# Patient Record
Sex: Female | Born: 1955 | Race: White | Hispanic: No | Smoking: Current every day smoker
Health system: Southern US, Community
[De-identification: ages and names within clinical notes are randomized; demographics above are authoritative.]

## PROBLEM LIST (undated history)

## (undated) DIAGNOSIS — M1990 Unspecified osteoarthritis, unspecified site: Secondary | ICD-10-CM

## (undated) DIAGNOSIS — I10 Essential (primary) hypertension: Secondary | ICD-10-CM

## (undated) DIAGNOSIS — B1920 Unspecified viral hepatitis C without hepatic coma: Secondary | ICD-10-CM

## (undated) HISTORY — PX: BREAST BIOPSY: SHX20

## (undated) HISTORY — PX: BREAST EXCISIONAL BIOPSY: SUR124

---
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
## 2014-12-26 ENCOUNTER — Other Ambulatory Visit: Payer: Self-pay | Admitting: Orthopedic Surgery

## 2014-12-26 DIAGNOSIS — M2392 Unspecified internal derangement of left knee: Secondary | ICD-10-CM

## 2014-12-26 DIAGNOSIS — M25562 Pain in left knee: Secondary | ICD-10-CM

## 2015-01-02 ENCOUNTER — Ambulatory Visit
Admission: RE | Admit: 2015-01-02 | Discharge: 2015-01-02 | Disposition: A | Discharge status: Home / Self Care | Payer: PRIVATE HEALTH INSURANCE | Source: Ambulatory Visit | Attending: Orthopedic Surgery | Admitting: Orthopedic Surgery

## 2015-01-02 DIAGNOSIS — S83242A Other tear of medial meniscus, current injury, left knee, initial encounter: Secondary | ICD-10-CM | POA: Insufficient documentation

## 2015-01-02 DIAGNOSIS — M25562 Pain in left knee: Secondary | ICD-10-CM | POA: Diagnosis present

## 2015-01-02 DIAGNOSIS — M2392 Unspecified internal derangement of left knee: Secondary | ICD-10-CM

## 2015-01-02 DIAGNOSIS — X58XXXA Exposure to other specified factors, initial encounter: Secondary | ICD-10-CM | POA: Insufficient documentation

## 2015-01-02 DIAGNOSIS — M179 Osteoarthritis of knee, unspecified: Secondary | ICD-10-CM | POA: Diagnosis not present

## 2015-01-08 ENCOUNTER — Other Ambulatory Visit: Payer: Self-pay | Admitting: Family Medicine

## 2015-01-08 DIAGNOSIS — Z1231 Encounter for screening mammogram for malignant neoplasm of breast: Secondary | ICD-10-CM

## 2015-01-15 ENCOUNTER — Ambulatory Visit
Admission: RE | Admit: 2015-01-15 | Discharge: 2015-01-15 | Disposition: A | Discharge status: Home / Self Care | Payer: PRIVATE HEALTH INSURANCE | Source: Ambulatory Visit | Attending: Family Medicine | Admitting: Family Medicine

## 2015-01-15 DIAGNOSIS — Z1231 Encounter for screening mammogram for malignant neoplasm of breast: Secondary | ICD-10-CM | POA: Insufficient documentation

## 2016-01-12 ENCOUNTER — Other Ambulatory Visit: Payer: Self-pay | Admitting: Family Medicine

## 2016-01-12 DIAGNOSIS — Z1231 Encounter for screening mammogram for malignant neoplasm of breast: Secondary | ICD-10-CM

## 2016-01-21 ENCOUNTER — Ambulatory Visit
Admission: RE | Admit: 2016-01-21 | Discharge: 2016-01-21 | Disposition: A | Discharge status: Home / Self Care | Payer: BLUE CROSS/BLUE SHIELD | Source: Ambulatory Visit | Attending: Family Medicine | Admitting: Family Medicine

## 2016-01-21 DIAGNOSIS — Z1231 Encounter for screening mammogram for malignant neoplasm of breast: Secondary | ICD-10-CM | POA: Diagnosis not present

## 2016-10-27 ENCOUNTER — Encounter: Payer: Self-pay | Admitting: *Deleted

## 2016-10-28 ENCOUNTER — Ambulatory Visit: Payer: BLUE CROSS/BLUE SHIELD | Admitting: Anesthesiology

## 2016-10-28 ENCOUNTER — Encounter
Admission: RE | Disposition: A | Discharge status: Home / Self Care | Payer: Self-pay | Source: Ambulatory Visit | Attending: Unknown Physician Specialty

## 2016-10-28 ENCOUNTER — Ambulatory Visit
Admission: RE | Admit: 2016-10-28 | Discharge: 2016-10-28 | Disposition: A | Discharge status: Home / Self Care | Payer: BLUE CROSS/BLUE SHIELD | Source: Ambulatory Visit | Attending: Unknown Physician Specialty | Admitting: Unknown Physician Specialty

## 2016-10-28 ENCOUNTER — Encounter: Payer: Self-pay | Admitting: *Deleted

## 2016-10-28 DIAGNOSIS — Z7982 Long term (current) use of aspirin: Secondary | ICD-10-CM | POA: Diagnosis not present

## 2016-10-28 DIAGNOSIS — Z9889 Other specified postprocedural states: Secondary | ICD-10-CM | POA: Insufficient documentation

## 2016-10-28 DIAGNOSIS — K635 Polyp of colon: Secondary | ICD-10-CM | POA: Insufficient documentation

## 2016-10-28 DIAGNOSIS — Z79891 Long term (current) use of opiate analgesic: Secondary | ICD-10-CM | POA: Insufficient documentation

## 2016-10-28 DIAGNOSIS — Z79899 Other long term (current) drug therapy: Secondary | ICD-10-CM | POA: Insufficient documentation

## 2016-10-28 DIAGNOSIS — Z1211 Encounter for screening for malignant neoplasm of colon: Secondary | ICD-10-CM | POA: Diagnosis present

## 2016-10-28 DIAGNOSIS — I10 Essential (primary) hypertension: Secondary | ICD-10-CM | POA: Diagnosis not present

## 2016-10-28 DIAGNOSIS — F17210 Nicotine dependence, cigarettes, uncomplicated: Secondary | ICD-10-CM | POA: Diagnosis not present

## 2016-10-28 DIAGNOSIS — K640 First degree hemorrhoids: Secondary | ICD-10-CM | POA: Insufficient documentation

## 2016-10-28 DIAGNOSIS — Z6841 Body Mass Index (BMI) 40.0 and over, adult: Secondary | ICD-10-CM | POA: Insufficient documentation

## 2016-10-28 DIAGNOSIS — M1990 Unspecified osteoarthritis, unspecified site: Secondary | ICD-10-CM | POA: Diagnosis not present

## 2016-10-28 DIAGNOSIS — Z803 Family history of malignant neoplasm of breast: Secondary | ICD-10-CM | POA: Diagnosis not present

## 2016-10-28 HISTORY — DX: Morbid (severe) obesity due to excess calories: E66.01

## 2016-10-28 HISTORY — PX: COLONOSCOPY WITH PROPOFOL: SHX5780

## 2016-10-28 HISTORY — DX: Essential (primary) hypertension: I10

## 2016-10-28 HISTORY — DX: Unspecified osteoarthritis, unspecified site: M19.90

## 2016-10-28 SURGERY — COLONOSCOPY WITH PROPOFOL
Anesthesia: General

## 2016-10-28 MED ORDER — PROPOFOL 10 MG/ML IV BOLUS
INTRAVENOUS | Status: AC
  Filled 2016-10-28: qty 20

## 2016-10-28 MED ORDER — EPHEDRINE SULFATE 50 MG/ML IJ SOLN
INTRAMUSCULAR | Status: DC | PRN
  Administered 2016-10-28: 10 mg via INTRAVENOUS

## 2016-10-28 MED ORDER — LIDOCAINE HCL 2 % EX GEL
CUTANEOUS | Status: AC
  Filled 2016-10-28: qty 5

## 2016-10-28 MED ORDER — PROPOFOL 10 MG/ML IV BOLUS
INTRAVENOUS | Status: DC | PRN
  Administered 2016-10-28: 40 mg via INTRAVENOUS
  Administered 2016-10-28: 20 mg via INTRAVENOUS

## 2016-10-28 MED ORDER — PROPOFOL 500 MG/50ML IV EMUL
INTRAVENOUS | Status: DC | PRN
  Administered 2016-10-28: 120 ug/kg/min via INTRAVENOUS

## 2016-10-28 MED ORDER — FENTANYL CITRATE (PF) 100 MCG/2ML IJ SOLN
INTRAMUSCULAR | Status: DC | PRN
  Administered 2016-10-28: 50 ug via INTRAVENOUS

## 2016-10-28 MED ORDER — EPHEDRINE SULFATE 50 MG/ML IJ SOLN
INTRAMUSCULAR | Status: AC
  Filled 2016-10-28: qty 1

## 2016-10-28 MED ORDER — SODIUM CHLORIDE 0.9 % IV SOLN
INTRAVENOUS | Status: DC
  Administered 2016-10-28: 10:00:00 via INTRAVENOUS

## 2016-10-28 MED ORDER — SODIUM CHLORIDE 0.9 % IV SOLN: INTRAVENOUS | Status: DC

## 2016-10-28 MED ORDER — MIDAZOLAM HCL 2 MG/2ML IJ SOLN
INTRAMUSCULAR | Status: DC | PRN
  Administered 2016-10-28: 2 mg via INTRAVENOUS

## 2016-10-28 MED ORDER — MIDAZOLAM HCL 2 MG/2ML IJ SOLN
INTRAMUSCULAR | Status: AC
  Filled 2016-10-28: qty 2

## 2016-10-28 NOTE — Transfer of Care (Signed)
Immediate Anesthesia Transfer of Care Note  Patient: Evelyn DaltonSherry Franken  Procedure(s) Performed: Procedure(s): COLONOSCOPY WITH PROPOFOL (N/A)  Patient Location: PACU  Anesthesia Type:General  Level of Consciousness: awake  Airway & Oxygen Therapy: Patient Spontanous Breathing and Patient connected to nasal cannula oxygen  Post-op Assessment: Report given to RN and Post -op Vital signs reviewed and stable  Post vital signs: Reviewed  Last Vitals:  Vitals:   10/28/16 0925  BP: (!) 165/87  Pulse: 83  Resp: 18  Temp: (!) 35.8 C    Last Pain:  Vitals:   10/28/16 0925  TempSrc: Tympanic  PainSc: 5          Complications: No apparent anesthesia complications

## 2016-10-28 NOTE — Anesthesia Postprocedure Evaluation (Signed)
Anesthesia Post Note  Patient: Evelyn DaltonSherry Yates  Procedure(s) Performed: Procedure(s) (LRB): COLONOSCOPY WITH PROPOFOL (N/A)  Patient location during evaluation: PACU Anesthesia Type: General Level of consciousness: awake Pain management: pain level controlled Vital Signs Assessment: post-procedure vital signs reviewed and stable Respiratory status: spontaneous breathing Cardiovascular status: stable Anesthetic complications: no     Last Vitals:  Vitals:   10/28/16 0925 10/28/16 1035  BP: (!) 165/87 110/62  Pulse: 83 83  Resp: 18 15  Temp: (!) 35.8 C (!) 35.6 C    Last Pain:  Vitals:   10/28/16 1035  TempSrc: Tympanic  PainSc:                  [name],Evelyn Riga

## 2016-10-28 NOTE — Op Note (Signed)
[facility] Gastroenterology Patient Name: Evelyn DaltonSherry Hepworth Procedure Date: 10/28/2016 9:55 AM MRN: [id number] Account #: [id number] Date of Birth: 11/25/1955 Admit Type: Outpatient Age: [age] Room: [facility] ENDO ROOM 3 Gender: Female Note Status: Finalized Procedure:            Colonoscopy Indications:          Screening for colorectal malignant neoplasm Providers:            Scot Junobert T. Dyan Labarbera, MD Referring MD:         Haynes Kernsharanjit S. Virk MD, MD (Referring MD) Medicines:            Propofol per Anesthesia Complications:        No immediate complications. Procedure:            Pre-Anesthesia Assessment:                       - After reviewing the risks and benefits, the patient                        was deemed in satisfactory condition to undergo the                        procedure.                       After obtaining informed consent, the colonoscope was                        passed under direct vision. Throughout the procedure,                        the patient's blood pressure, pulse, and oxygen                        saturations were monitored continuously. The                        Colonoscope was introduced through the anus and                        advanced to the the cecum, identified by appendiceal                        orifice and ileocecal valve. The colonoscopy was                        performed without difficulty. The patient tolerated the                        procedure well. The quality of the bowel preparation                        was good. Findings:      A diminutive polyp was found in the sigmoid colon. The polyp was       sessile. The polyp was removed with a jumbo cold forceps. Resection and       retrieval were complete.      Internal hemorrhoids were found during endoscopy. The hemorrhoids were       medium-sized and Grade I (internal hemorrhoids that do not prolapse).      The  exam was otherwise without abnormality. Impression:            - One diminutive polyp in the sigmoid colon, removed                        with a jumbo cold forceps. Resected and retrieved.                       - Internal hemorrhoids.                       - The examination was otherwise normal. Recommendation:       - Await pathology results. Scot Junobert T Carlie Solorzano, MD 10/28/2016 10:33:40 AM This report has been signed electronically. Number of Addenda: 0 Note Initiated On: 10/28/2016 9:55 AM Scope Withdrawal Time: 0 hours 14 minutes 51 seconds  Total Procedure Duration: 0 hours 22 minutes 27 seconds       [facility]

## 2016-10-28 NOTE — Anesthesia Post-op Follow-up Note (Signed)
Anesthesia QCDR form completed.        

## 2016-10-28 NOTE — Anesthesia Preprocedure Evaluation (Signed)
Anesthesia Evaluation  Patient identified by MRN, date of birth, ID band Patient awake    Reviewed: Allergy & Precautions, NPO status , Patient's Chart, lab work & pertinent test results  Airway Mallampati: II       Dental  (+) Teeth Intact   Pulmonary COPD, Current Smoker,     + decreased breath sounds      Cardiovascular Exercise Tolerance: Good hypertension, Pt. on medications  Rhythm:Regular     Neuro/Psych negative neurological ROS  negative psych ROS   GI/Hepatic negative GI ROS, Neg liver ROS,   Endo/Other  negative endocrine ROS  Renal/GU negative Renal ROS     Musculoskeletal  (+) Arthritis ,   Abdominal (+) + obese,   Peds negative pediatric ROS (+)  Hematology negative hematology ROS (+)   Anesthesia Other Findings   Reproductive/Obstetrics                             Anesthesia Physical Anesthesia Plan  ASA: II  Anesthesia Plan: General   Post-op Pain Management:    Induction: Intravenous  PONV Risk Score and Plan: 0  Airway Management Planned: Natural Airway and Nasal Cannula  Additional Equipment:   Intra-op Plan:   Post-operative Plan:   Informed Consent: I have reviewed the patients History and Physical, chart, labs and discussed the procedure including the risks, benefits and alternatives for the proposed anesthesia with the patient or authorized representative who has indicated his/her understanding and acceptance.     Plan Discussed with: Surgeon  Anesthesia Plan Comments:         Anesthesia Quick Evaluation

## 2016-10-28 NOTE — H&P (Signed)
   Primary Care Physician:  Sula RumpleVirk, Charanjit, MD Primary Gastroenterologist:  Dr. Mechele CollinElliott  Pre-Procedure History & Physical: HPI:  Evelyn Yates is a 61 y.o. female is here for an colonoscopy.   Past Medical History:  Diagnosis Date  . Arthritis   . Hypertension   . Morbid obesity (HCC)     Past Surgical History:  Procedure Laterality Date  . BREAST BIOPSY Right    neg pt was 61 yrs old  . FRACTURE SURGERY Right 1977   Right ankle surger, pins and screws    Prior to Admission medications   Medication Sig Start Date End Date Taking? Authorizing Provider  aspirin EC 81 MG tablet Take 81 mg by mouth daily.   Yes [provider]  hydrochlorothiazide (MICROZIDE) 12.5 MG capsule Take 12.5 mg by mouth daily.   Yes [provider]  Influenza Vac Types A & B PF (FLUVIRIN PRESERVATIVE FREE) 0.5 ML SUSY Inject into the muscle.   Yes [provider]  methadone (METHADOSE) 40 MG disintegrating tablet Take 85 mg by mouth daily.   Yes [provider]    Allergies as of 07/11/2016  . (Not on File)    Family History  Problem Relation Age of Onset  . Breast cancer Mother 3467    Social History   Social History  . Marital status: Divorced    Spouse name: N/A  . Number of children: N/A  . Years of education: N/A   Occupational History  . Not on file.   Social History Main Topics  . Smoking status: Heavy Tobacco Smoker    Packs/day: 3.00    Types: Cigarettes  . Smokeless tobacco: Never Used  . Alcohol use No  . 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 Sexual activity: Not on file   Other Topics Concern  . Not on file   Social History Narrative  . No narrative on file    Review of Systems: See HPI, otherwise negative ROS  Physical Exam: BP (!) 165/87   Pulse 83   Temp (!) 96.4 F (35.8 C) (Tympanic)   Resp 18   Ht 5\' 7"  (1.702 m)   Wt 121.6 kg (268 lb)   SpO2 100%   BMI 41.97 kg/m  General:   Alert,   pleasant and cooperative in NAD Head:  Normocephalic and atraumatic. Neck:  Supple; no masses or thyromegaly. Lungs:  Clear throughout to auscultation.    Heart:  Regular rate and rhythm. Abdomen:  Soft, nontender and nondistended. Normal bowel sounds, without guarding, and without rebound.   Neurologic:  Alert and  oriented x4;  grossly normal neurologically.  Impression/Plan: Evelyn Yates is here for an colonoscopy to be performed for screening colonoscopy  Risks, benefits, limitations, and alternatives regarding  colonoscopy have been reviewed with the patient.  Questions have been answered.  All parties agreeable.   Lynnae PrudeELLIOTT, Mikiya Nebergall, MD  10/28/2016, 10:00 AM

## 2016-10-31 ENCOUNTER — Encounter: Payer: Self-pay | Admitting: Unknown Physician Specialty

## 2016-10-31 LAB — SURGICAL PATHOLOGY

## 2017-01-09 ENCOUNTER — Encounter: Payer: Self-pay | Admitting: Emergency Medicine

## 2017-01-09 ENCOUNTER — Ambulatory Visit
Admission: EM | Admit: 2017-01-09 | Discharge: 2017-01-09 | Disposition: A | Discharge status: Home / Self Care | Payer: BLUE CROSS/BLUE SHIELD | Attending: Family Medicine | Admitting: Family Medicine

## 2017-01-09 DIAGNOSIS — L03031 Cellulitis of right toe: Secondary | ICD-10-CM

## 2017-01-09 DIAGNOSIS — L02611 Cutaneous abscess of right foot: Secondary | ICD-10-CM

## 2017-01-09 MED ORDER — SULFAMETHOXAZOLE-TRIMETHOPRIM 800-160 MG PO TABS: 1.0000 | ORAL_TABLET | Freq: Two times a day (BID) | ORAL | 0 refills | Status: DC

## 2017-01-09 NOTE — ED Triage Notes (Signed)
Patient states she has an infection in there right fourth toe >1 month. Patient states it is painful and she believes infected.

## 2017-01-09 NOTE — ED Provider Notes (Signed)
[facility]    CSN: [id number] Arrival date & time: 01/09/17  1152     History   Chief Complaint Chief Complaint  Patient presents with  . Foot Pain    HPI Evelyn Yates is a 61 y.o. female.   61 yo female with a c/o right fourth toe pain, swelling and redness over the last week. Denies any fevers, chills or drainage.    The history is provided by the patient.    Past Medical History:  Diagnosis Date  . Arthritis   . Hypertension   . Morbid obesity (HCC)     There are no active problems to display for this patient.   Past Surgical History:  Procedure Laterality Date  . BREAST BIOPSY Right    neg pt was 61 yrs old  . COLONOSCOPY WITH PROPOFOL N/A 10/28/2016   Procedure: COLONOSCOPY WITH PROPOFOL;  Surgeon: Scot Jun, MD;  Location: [facility] ENDOSCOPY;  Service: Endoscopy;  Laterality: N/A;  . FRACTURE SURGERY Right 1977   Right ankle surger, pins and screws    OB History    No data available       Home Medications    Prior to Admission medications   Medication Sig Start Date End Date Taking? Authorizing Provider  aspirin EC 81 MG tablet Take 81 mg by mouth daily.   Yes [provider]  hydrochlorothiazide (MICROZIDE) 12.5 MG capsule Take 12.5 mg by mouth daily.   Yes [provider]  methadone (METHADOSE) 40 MG disintegrating tablet Take 85 mg by mouth daily.   Yes [provider]  Influenza Vac Types A & B PF (FLUVIRIN PRESERVATIVE FREE) 0.5 ML SUSY Inject into the muscle.    [provider]  sulfamethoxazole-trimethoprim (BACTRIM DS,SEPTRA DS) 800-160 MG tablet Take 1 tablet by mouth 2 (two) times daily. 01/09/17   Evelyn Mccallum, MD    Family History Family History  Problem Relation Age of Onset  . Breast cancer Mother 56    Social History Social History  Substance Use Topics  . Smoking status: Current Every Day Smoker    Packs/day: 1.00    Types: Cigarettes  . Smokeless tobacco: Never  Used  . Alcohol use No     Allergies   Shellfish-derived products   Review of Systems Review of Systems   Physical Exam Triage Vital Signs ED Triage Vitals [01/09/17 1206]  Enc Vitals Group     BP (!) 175/73     Pulse Rate 88     Resp 16     Temp 98 F (36.7 C)     Temp Source Oral     SpO2 98 %     Weight 260 lb (117.9 kg)     Height 5' 6.5" (1.689 m)     Head Circumference      Peak Flow      Pain Score 0     Pain Loc      Pain Edu?      Excl. in GC?    No data found.   Updated Vital Signs BP (!) 150/80 (BP Location: Right Arm)   Pulse 88   Temp 98 F (36.7 C) (Oral)   Resp 16   Ht 5' 6.5" (1.689 m)   Wt 260 lb (117.9 kg)   SpO2 98%   BMI 41.34 kg/m   Visual Acuity Right Eye Distance:   Left Eye Distance:   Bilateral Distance:    Right Eye Near:   Left Eye  Near:    Bilateral Near:     Physical Exam  Constitutional: She appears well-developed and well-nourished.  Musculoskeletal:       Right foot: There is tenderness (over the fourth toe; along the blanchable erythema, warmth and edema) and swelling.       Feet:  Skin: She is not diaphoretic. There is erythema.  Nursing note and vitals reviewed.    UC Treatments / Results  Labs (all labs ordered are listed, but only abnormal results are displayed) Labs Reviewed - No data to display  EKG  EKG Interpretation None       Radiology No results found.  Procedures Procedures (including critical care time)  Medications Ordered in UC Medications - No data to display   Initial Impression / Assessment and Plan / UC Course  I have reviewed the triage vital signs and the nursing notes.  Pertinent labs & imaging results that were available during my care of the patient were reviewed by me and considered in my medical decision making (see chart for details).       Final Clinical Impressions(s) / UC Diagnoses   Final diagnoses:  Abscess or cellulitis of toe, right    New  Prescriptions Discharge Medication List as of 01/09/2017 12:22 PM    START taking these medications   Details  sulfamethoxazole-trimethoprim (BACTRIM DS,SEPTRA DS) 800-160 MG tablet Take 1 tablet by mouth 2 (two) times daily., Starting Mon 01/09/2017, Normal       1. diagnosis reviewed with patient 2. rx as per orders above; reviewed possible side effects, interactions, risks and benefits  3. Recommend supportive treatment with elevation, warm compresses 4. Follow-up prn if symptoms worsen or don't improve  Controlled Substance Prescriptions [place] Controlled Substance Registry consulted? Not Applicable   Evelyn Mccallum, MD 01/09/17 1242

## 2017-01-20 ENCOUNTER — Other Ambulatory Visit: Payer: Self-pay | Admitting: Family Medicine

## 2017-01-20 DIAGNOSIS — Z1231 Encounter for screening mammogram for malignant neoplasm of breast: Secondary | ICD-10-CM

## 2017-02-06 ENCOUNTER — Ambulatory Visit
Admission: EM | Admit: 2017-02-06 | Discharge: 2017-02-06 | Disposition: A | Discharge status: Home / Self Care | Payer: BLUE CROSS/BLUE SHIELD | Attending: Family Medicine | Admitting: Family Medicine

## 2017-02-06 ENCOUNTER — Other Ambulatory Visit: Payer: Self-pay

## 2017-02-06 ENCOUNTER — Encounter: Payer: Self-pay | Admitting: Emergency Medicine

## 2017-02-06 ENCOUNTER — Ambulatory Visit (INDEPENDENT_AMBULATORY_CARE_PROVIDER_SITE_OTHER): Payer: BLUE CROSS/BLUE SHIELD

## 2017-02-06 DIAGNOSIS — M79671 Pain in right foot: Secondary | ICD-10-CM

## 2017-02-06 DIAGNOSIS — L03115 Cellulitis of right lower limb: Secondary | ICD-10-CM | POA: Diagnosis not present

## 2017-02-06 MED ORDER — MELOXICAM 15 MG PO TABS: 15.0000 mg | ORAL_TABLET | Freq: Every day | ORAL | 0 refills | Status: DC | PRN

## 2017-02-06 MED ORDER — MUPIROCIN 2 % EX OINT: TOPICAL_OINTMENT | CUTANEOUS | 0 refills | Status: DC

## 2017-02-06 MED ORDER — SULFAMETHOXAZOLE-TRIMETHOPRIM 800-160 MG PO TABS: 1.0000 | ORAL_TABLET | Freq: Two times a day (BID) | ORAL | 0 refills | Status: AC

## 2017-02-06 NOTE — Discharge Instructions (Signed)
Take medication as prescribed. Rest. Elevate. Keep area clean. Monitor closely.   Follow [facility] your primary care physician this week [facility] discussed. Return to Urgent care for new or worsening concerns.

## 2017-02-06 NOTE — ED Provider Notes (Signed)
[facility] ____________________________________________  Time seen: Approximately 12:11 PM  I have reviewed the triage vital signs and the nursing notes.   HISTORY  Chief Complaint Foot Pain   HPI Evelyn Yates is a 61 y.o. female presenting for evaluation of redness, swelling some pain to right dorsal distal foot as in present gradual onset over 1 week.  Patient reports she had the same happened approximately 1 month ago was seen in urgent care and was treated with oral Bactrim.  States without antibiotic at the rash and redness almost  fully resolved.  States after completing antibiotic the redness area gradually came back.  Denies known trigger.  Denies insect bite, injury or trauma.  Denies area being rubbed by a shoe.  States that she has had history of similar occurring in the past as well when she was in her 5020s.  Patient states her fourth toe at that time had a significant infection and has always had somewhat of a callus area to the distal toe.  States the callus area prior to last being seen had developed pus at the tip but the antibiotic cleared away.  States the callus area remains, no obvious signs of pus per patient.  Denies drainage, fevers, other pain.  Denies other skin changes.  Denies history of MRSA.  States she does have chronic bilateral lower externally swelling, denies right lower extremity pain or increased swelling from baseline.  Denies history of DVT.  Reports has continue to remain active.  Denies chest pain, shortness of breath, abdominal pain. Denies recent sickness. Denies other recent antibiotic use.  Denies cardiac history.  Denies renal insufficiency.   Past Medical History:  Diagnosis Date  . Arthritis   . Hypertension   . Morbid obesity (HCC)     There are no active problems to display for this patient.   Past Surgical History:  Procedure Laterality Date  . BREAST BIOPSY Right    neg pt was 61 yrs old  . FRACTURE SURGERY Right  1977   Right ankle surger, pins and screws     No current facility-administered medications for this encounter.   Current Outpatient Medications:  .  aspirin EC 81 MG tablet, Take 81 mg by mouth daily., Disp: , Rfl:  .  hydrochlorothiazide (MICROZIDE) 12.5 MG capsule, Take 12.5 mg by mouth daily., Disp: , Rfl:  .  Influenza Vac Types A & B PF (FLUVIRIN PRESERVATIVE FREE) 0.5 ML SUSY, Inject into the muscle., Disp: , Rfl:  .  meloxicam (MOBIC) 15 MG tablet, Take 1 tablet (15 mg total) daily as needed by mouth., Disp: 10 tablet, Rfl: 0 .  methadone (METHADOSE) 40 MG disintegrating tablet, Take 85 mg by mouth daily., Disp: , Rfl:  .  mupirocin ointment (BACTROBAN) 2 %, Apply two times a day for 7 days., Disp: 22 g, Rfl: 0 .  sulfamethoxazole-trimethoprim (BACTRIM DS,SEPTRA DS) 800-160 MG tablet, Take 1 tablet 2 (two) times daily for 10 days by mouth., Disp: 20 tablet, Rfl: 0  Allergies Shellfish-derived products  Family History  Problem Relation Age of Onset  . Breast cancer Mother 9367    Social History Social History   Tobacco Use  . Smoking status: Current Every Day Smoker    Packs/day: 1.00    Types: Cigarettes  . Smokeless tobacco: Never Used  Substance Use Topics  . Alcohol use: No  . 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 Respiratory: Denies shortness of breath. Gastrointestinal: No abdominal pain.   Musculoskeletal: Negative for back pain. Skin: As above.    ____________________________________________   PHYSICAL EXAM:  VITAL SIGNS: ED Triage Vitals  Enc Vitals Group     BP 02/06/17 1128 (!) 151/68     Pulse Rate 02/06/17 1128 97     Resp 02/06/17 1128 16     Temp 02/06/17 1128 98.7 F (37.1 C)     Temp Source 02/06/17 1128 Oral     SpO2 02/06/17 1128 99 %     Weight 02/06/17 1126 250 lb (113.4 kg)     Height 02/06/17 1126 5' 6.5" (1.689 m)      Head Circumference --      Peak Flow --      Pain Score 02/06/17 1126 2     Pain Loc --      Pain Edu? --      Excl. in GC? --     Constitutional: Alert and oriented. Well appearing and in no acute distress. Cardiovascular: Normal rate, regular rhythm. Grossly normal heart sounds.  Good peripheral circulation. Respiratory: Normal respiratory effort without tachypnea nor retractions. Breath sounds are clear and equal bilaterally. No wheezes, rales, rhonchi. Musculoskeletal:  Steady gait. Bilateral pedal pulses equal and easily palpated. Neurologic:  Normal speech and language. Speech is normal. No gait instability.  Skin:  Skin is warm, dry. Except: Right foot fourth distal toe tenderness with mild erythema and distal callus, no drainage, slight decreased sensation, per patient chronic.  Right dorsal midfoot mild to moderate localized erythema, skin appears intact, no induration, no fluctuance, tenderness present at base of second third and fourth toes.  Right lower extremity otherwise nontender.  Mild bilateral lower extremity edema. Psychiatric: Mood and affect are normal. Speech and behavior are normal. Patient exhibits appropriate insight and judgment   ___________________________________________   LABS (all labs ordered are listed, but only abnormal results are displayed)  Labs Reviewed - No data to display ____________________________________________  RADIOLOGY  Dg Foot Complete Right  Result Date: 02/06/2017 CLINICAL DATA:  Pain and swelling at fourth toe and midfoot. EXAM: RIGHT FOOT COMPLETE - 3+ VIEW COMPARISON:  None. FINDINGS: Postoperative changes in the distal tibia and fibula. Advanced degenerative changes in the ankle joint and hindfoot. No fracture, subluxation or dislocation. IMPRESSION: No acute bony abnormality. Electronically Signed   By: Charlett Nose M.D.   On: 02/06/2017 12:45   ____________________________________________   PROCEDURES Procedures     INITIAL IMPRESSION / ASSESSMENT AND PLAN / ED COURSE  Pertinent labs & imaging results that were available during my care of the patient were reviewed by me and considered in my medical decision making (see chart for details).  Well appearing patient.  No acute distress.  Right foot with appearance of skin infection, patient reports history of similar approximately 1 month ago and years in past.  Will evaluate by x-ray, will plan to place on Bactrim and bactroban.  Right foot x-ray reviewed, per radiologist no acute abnormality.  Discussed with patient will place patient back on Bactrim, topical Bactroban, oral Mobic as needed.  Work note for today and tomorrow.  Encouraged keeping clean, elevation.  Discussed and encouraged patient to follow-up with primary care or [place] wound clinic as a close follow-up and possible debridement of the callus area could be needed.  Discussed strict follow-up and return parameters.Discussed indication, risks and benefits of medications with patient.  Discussed follow up and return  parameters including no resolution or any worsening concerns. Patient verbalized understanding and agreed to plan.  ____________________________________________   FINAL CLINICAL IMPRESSION(S) / ED DIAGNOSES  Final diagnoses:  Cellulitis of right foot     ED Discharge Orders        Ordered    sulfamethoxazole-trimethoprim (BACTRIM DS,SEPTRA DS) 800-160 MG tablet  2 times daily     02/06/17 1250    mupirocin ointment (BACTROBAN) 2 %     02/06/17 1250    meloxicam (MOBIC) 15 MG tablet  Daily PRN     02/06/17 1250       Note: This dictation was prepared with Dragon dictation along with smaller phrase technology. Any transcriptional errors that result from this process are unintentional.         Renford DillsMiller, Kianna Billet, NP 02/06/17 1259

## 2017-02-06 NOTE — ED Triage Notes (Signed)
Patient c/o pain and redness in her right foot.  Patient was seen and treated for infection in her right foot.

## 2017-02-07 ENCOUNTER — Ambulatory Visit
Admission: RE | Admit: 2017-02-07 | Discharge: 2017-02-07 | Disposition: A | Discharge status: Home / Self Care | Payer: BLUE CROSS/BLUE SHIELD | Source: Ambulatory Visit | Attending: Family Medicine | Admitting: Family Medicine

## 2017-02-07 DIAGNOSIS — Z1231 Encounter for screening mammogram for malignant neoplasm of breast: Secondary | ICD-10-CM

## 2017-02-14 ENCOUNTER — Encounter: Payer: Self-pay | Admitting: Emergency Medicine

## 2017-02-14 ENCOUNTER — Emergency Department
Admission: EM | Admit: 2017-02-14 | Discharge: 2017-02-14 | Disposition: A | Discharge status: Home / Self Care | Payer: BLUE CROSS/BLUE SHIELD | Attending: Emergency Medicine | Admitting: Emergency Medicine

## 2017-02-14 DIAGNOSIS — F17210 Nicotine dependence, cigarettes, uncomplicated: Secondary | ICD-10-CM | POA: Diagnosis not present

## 2017-02-14 DIAGNOSIS — L03031 Cellulitis of right toe: Secondary | ICD-10-CM

## 2017-02-14 DIAGNOSIS — I10 Essential (primary) hypertension: Secondary | ICD-10-CM | POA: Diagnosis not present

## 2017-02-14 DIAGNOSIS — Z79899 Other long term (current) drug therapy: Secondary | ICD-10-CM | POA: Insufficient documentation

## 2017-02-14 LAB — COMPREHENSIVE METABOLIC PANEL
ALBUMIN: 3.7 g/dL (ref 3.5–5.0)
ALT: 38 U/L (ref 14–54)
ANION GAP: 9 (ref 5–15)
AST: 57 U/L — ABNORMAL HIGH (ref 15–41)
Alkaline Phosphatase: 106 U/L (ref 38–126)
BILIRUBIN TOTAL: 0.7 mg/dL (ref 0.3–1.2)
BUN: 16 mg/dL (ref 6–20)
CHLORIDE: 97 mmol/L — AB (ref 101–111)
CO2: 28 mmol/L (ref 22–32)
Calcium: 10.3 mg/dL (ref 8.9–10.3)
Creatinine, Ser: 0.92 mg/dL (ref 0.44–1.00)
GFR calc Af Amer: >60 mL/min (ref >60)
GLUCOSE: 124 mg/dL — AB (ref 65–99)
POTASSIUM: 5.2 mmol/L — AB (ref 3.5–5.1)
Sodium: 134 mmol/L — ABNORMAL LOW (ref 135–145)
TOTAL PROTEIN: 8 g/dL (ref 6.5–8.1)

## 2017-02-14 LAB — CBC WITH DIFFERENTIAL/PLATELET
BASOS ABS: 0 10*3/uL (ref 0–0.1)
Basophils Relative: 1 %
Eosinophils Absolute: 0.6 10*3/uL (ref 0–0.7)
Eosinophils Relative: 10 %
HEMATOCRIT: 45.3 % (ref 35.0–47.0)
Hemoglobin: 15 g/dL (ref 12.0–16.0)
LYMPHS PCT: 31 %
Lymphs Abs: 1.8 10*3/uL (ref 1.0–3.6)
MCH: 29.7 pg (ref 26.0–34.0)
MCHC: 33.1 g/dL (ref 32.0–36.0)
MCV: 89.5 fL (ref 80.0–100.0)
Monocytes Absolute: 0.7 10*3/uL (ref 0.2–0.9)
Monocytes Relative: 11 %
NEUTROS ABS: 2.8 10*3/uL (ref 1.4–6.5)
Neutrophils Relative %: 47 %
Platelets: 194 10*3/uL (ref 150–440)
RBC: 5.06 MIL/uL (ref 3.80–5.20)
RDW: 14.6 % — ABNORMAL HIGH (ref 11.5–14.5)
WBC: 5.9 10*3/uL (ref 3.6–11.0)

## 2017-02-14 LAB — LACTIC ACID, PLASMA: Lactic Acid, Venous: 1.7 mmol/L (ref 0.5–1.9)

## 2017-02-14 MED ORDER — DOXYCYCLINE HYCLATE 100 MG PO CAPS: 100.0000 mg | ORAL_CAPSULE | Freq: Two times a day (BID) | ORAL | 0 refills | Status: DC

## 2017-02-14 MED ORDER — VANCOMYCIN HCL 10 G IV SOLR
1500.0000 mg | Freq: Once | INTRAVENOUS | Status: AC
  Administered 2017-02-14: 1500 mg via INTRAVENOUS
  Filled 2017-02-14: qty 1500

## 2017-02-14 NOTE — ED Triage Notes (Addendum)
Patient presents to the ED with an infection to her right foot.  Patient states she has been on 2 rounds of oral antibiotics, one for 10 days and the second for 8 days and infection is not improving.  Patient denies history of diabetes.  Patient states, "My doctor sent me since this isn't getting better.

## 2017-02-14 NOTE — ED Triage Notes (Signed)
First Nurse Note:  Patient sent by PCP for ED evaluation and possible IV Antibiotics for persistent foot infection.  Patient states she has taken two rounds of oral antibiotics, but infections persists and has worsened.  Patient is ambulatory. AAOx3.  Skin warm and dry. NAD

## 2017-02-14 NOTE — ED Provider Notes (Signed)
[facility] Emergency Department Provider Note  ____________________________________________   First MD Initiated Contact with Patient 02/14/17 1929     (approximate)  I have reviewed the triage vital signs and the nursing notes.   HISTORY  Chief Complaint Wound Infection (to foot)  HPI Evelyn DaltonSherry Rosenzweig is a 61 y.o. female with a history of hypertension and past foot cellulitis 20 years ago who was presented to the emergency department with a right fourth toe which is red and swollen.  He says that the redness and swelling has now progressed onto the dorsum of her foot.  She said it is painful.  She says that she is on her second course of Bactrim and although it is helped the redness persists.  She says that about 20 years ago with the last time she had that she got a dose of IV antibiotics and it resolved the complaint.  She says that she was sent in by her primary care doctor today for further treatment because she is almost on her second course of Bactrim and the symptoms are persisting.  She says that she also had pus drainage from the nail bed over the past week or so but it has stopped at this time.  Past Medical History:  Diagnosis Date  . Arthritis   . Hypertension   . Morbid obesity (HCC)     There are no active problems to display for this patient.   Past Surgical History:  Procedure Laterality Date  . BREAST BIOPSY Right    neg pt was 61 yrs old  . COLONOSCOPY WITH PROPOFOL N/A 10/28/2016   Procedure: COLONOSCOPY WITH PROPOFOL;  Surgeon: Scot JunElliott, Robert T, MD;  Location: [facility] ENDOSCOPY;  Service: Endoscopy;  Laterality: N/A;  . FRACTURE SURGERY Right 1977   Right ankle surger, pins and screws    Prior to Admission medications   Medication Sig Start Date End Date Taking? Authorizing Provider  aspirin EC 81 MG tablet Take 81 mg by mouth daily.    [provider]  hydrochlorothiazide (MICROZIDE) 12.5 MG capsule Take 12.5 mg by mouth  daily.    [provider]  Influenza Vac Types A & B PF (FLUVIRIN PRESERVATIVE FREE) 0.5 ML SUSY Inject into the muscle.    [provider]  meloxicam (MOBIC) 15 MG tablet Take 1 tablet (15 mg total) daily as needed by mouth. 02/06/17   Renford DillsMiller, Lindsey, NP  methadone (METHADOSE) 40 MG disintegrating tablet Take 85 mg by mouth daily.    [provider]  mupirocin ointment (BACTROBAN) 2 % Apply two times a day for 7 days. 02/06/17   Renford DillsMiller, Lindsey, NP  sulfamethoxazole-trimethoprim (BACTRIM DS,SEPTRA DS) 800-160 MG tablet Take 1 tablet 2 (two) times daily for 10 days by mouth. 02/06/17 02/16/17  Renford DillsMiller, Lindsey, NP    Allergies Shellfish-derived products  Family History  Problem Relation Age of Onset  . Breast cancer Mother 7767    Social History Social History   Tobacco Use  . Smoking status: Current Every Day Smoker    Packs/day: 1.00    Types: Cigarettes  . Smokeless tobacco: Never Used  Substance Use Topics  . Alcohol use: No  . 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 ENT: No sore throat. Cardiovascular: Denies chest pain. Respiratory: Denies shortness of breath. Gastrointestinal: No abdominal pain.  No nausea, no vomiting.  No diarrhea.  No  constipation. Genitourinary: Negative for dysuria. Musculoskeletal: Negative for back pain. Skin: Negative for rash. Neurological: Negative for headaches, focal weakness or numbness.   ____________________________________________   PHYSICAL EXAM:  VITAL SIGNS: ED Triage Vitals  Enc Vitals Group     BP 02/14/17 1808 (!) 153/95     Pulse Rate 02/14/17 1808 97     Resp 02/14/17 1808 18     Temp 02/14/17 1808 98.2 F (36.8 C)     Temp Source 02/14/17 1808 Oral     SpO2 02/14/17 1808 98 %     Weight 02/14/17 1808 255 lb (115.7 kg)     Height 02/14/17 1808 5' 6.5" (1.689 m)     Head Circumference --       Peak Flow --      Pain Score 02/14/17 1807 0     Pain Loc --      Pain Edu? --      Excl. in GC? --     Constitutional: Alert and oriented. Well appearing and in no acute distress. Eyes: Conjunctivae are normal.  Head: Atraumatic. Nose: No congestion/rhinnorhea. Mouth/Throat: Mucous membranes are moist.  Neck: No stridor.   Cardiovascular: Normal rate, regular rhythm. Grossly normal heart sounds.  Good peripheral circulation with equal and bilateral dorsalis pedis pulses. Respiratory: Normal respiratory effort.  No retractions. Lungs CTAB. Gastrointestinal: Soft and nontender. No distention. No CVA tenderness. Musculoskeletal:   Right foot with slightly swollen dorsum which is mildly tender to palpation.  The right fourth toe at the DIP portion is red but without fluctuance.  Very mild warmth.  No pus drainage.  No pus drainage from around the nail bed.  Neurologic:  Normal speech and language. No gross focal neurologic deficits are appreciated. Skin: As above Psychiatric: Mood and affect are normal. Speech and behavior are normal.  ____________________________________________   LABS (all labs ordered are listed, but only abnormal results are displayed)  Labs Reviewed  COMPREHENSIVE METABOLIC PANEL - Abnormal; Notable for the following components:      Result Value   Sodium 134 (*)    Potassium 5.2 (*)    Chloride 97 (*)    Glucose, Bld 124 (*)    AST 57 (*)    All other components within normal limits  CBC WITH DIFFERENTIAL/PLATELET - Abnormal; Notable for the following components:   RDW 14.6 (*)    All other components within normal limits  LACTIC ACID, PLASMA  LACTIC ACID, PLASMA   ____________________________________________  EKG   ____________________________________________  RADIOLOGY   ____________________________________________   PROCEDURES  Procedure(s) performed:   Procedures  Critical Care performed:    ____________________________________________   INITIAL IMPRESSION / ASSESSMENT AND PLAN / ED COURSE  Pertinent labs & imaging results that were available during my care of the patient were reviewed by me and considered in my medical decision making (see chart for details).  DDX: Cellulitis, less likely to be necrotizing fasciitis.  Unlikely to be septic joint or osteomyelitis.  As part of my medical decision making, I reviewed the following data within the electronic MEDICAL RECORD NUMBER Notes from prior ED visits  Patient did receive a dose of IV vancomycin and then be discharged with doxycycline.  She will be following up with her primary care doctor.  Exam is not consistent with necrotizing fasciitis as it is not out of proportion to the exam.  Patient to follow-up with her primary care doctor.  She is understanding of the plan and willing to comply.  ____________________________________________   FINAL CLINICAL IMPRESSION(S) / ED DIAGNOSES  Cellulitis of right toe and foot.    NEW MEDICATIONS STARTED DURING THIS VISIT:  This SmartLink is deprecated. Use AVSMEDLIST instead to display the medication list for a patient.   Note:  This document was prepared using Dragon voice recognition software and may include unintentional dictation errors.     Myrna Blazer, MD 02/14/17 2022

## 2017-04-10 ENCOUNTER — Ambulatory Visit (INDEPENDENT_AMBULATORY_CARE_PROVIDER_SITE_OTHER): Payer: BLUE CROSS/BLUE SHIELD

## 2017-04-10 ENCOUNTER — Ambulatory Visit
Admission: EM | Admit: 2017-04-10 | Discharge: 2017-04-10 | Disposition: A | Discharge status: Home / Self Care | Payer: BLUE CROSS/BLUE SHIELD | Attending: Family Medicine | Admitting: Family Medicine

## 2017-04-10 ENCOUNTER — Other Ambulatory Visit: Payer: Self-pay

## 2017-04-10 DIAGNOSIS — B9789 Other viral agents as the cause of diseases classified elsewhere: Secondary | ICD-10-CM

## 2017-04-10 DIAGNOSIS — J9801 Acute bronchospasm: Secondary | ICD-10-CM | POA: Diagnosis not present

## 2017-04-10 DIAGNOSIS — J988 Other specified respiratory disorders: Secondary | ICD-10-CM | POA: Diagnosis not present

## 2017-04-10 DIAGNOSIS — R062 Wheezing: Secondary | ICD-10-CM | POA: Diagnosis not present

## 2017-04-10 HISTORY — DX: Unspecified viral hepatitis C without hepatic coma: B19.20

## 2017-04-10 MED ORDER — PREDNISONE 20 MG PO TABS: ORAL_TABLET | ORAL | 0 refills | Status: DC

## 2017-04-10 MED ORDER — IPRATROPIUM-ALBUTEROL 0.5-2.5 (3) MG/3ML IN SOLN
3.0000 mL | Freq: Once | RESPIRATORY_TRACT | Status: AC
  Administered 2017-04-10: 3 mL via RESPIRATORY_TRACT

## 2017-04-10 MED ORDER — ALBUTEROL SULFATE HFA 108 (90 BASE) MCG/ACT IN AERS: 1.0000 | INHALATION_SPRAY | Freq: Four times a day (QID) | RESPIRATORY_TRACT | 0 refills | Status: DC | PRN

## 2017-04-10 MED ORDER — IBUPROFEN 600 MG PO TABS
600.0000 mg | ORAL_TABLET | Freq: Once | ORAL | Status: AC
  Administered 2017-04-10: 600 mg via ORAL

## 2017-04-10 NOTE — ED Triage Notes (Signed)
Patient complains of cough, congestion, sinus pain and pressure, shortness of breath, wheezing, fever, body aches, chills x sudden onset 2 days ago.

## 2017-04-10 NOTE — ED Provider Notes (Addendum)
[facility]    CSN: [id number] Arrival date & time: 04/10/17  1235     History   Chief Complaint Chief Complaint  Patient presents with  . URI    HPI Evelyn Yates is a 62 y.o. female.   The history is provided by the patient.  URI  Presenting symptoms: congestion, cough and fever   Severity:  Moderate Onset quality:  Sudden Duration:  2 days Timing:  Constant Progression:  Worsening Chronicity:  New Relieved by:  None tried Associated symptoms: myalgias and wheezing   Risk factors: sick contacts   Risk factors: not elderly, no chronic cardiac disease, no chronic kidney disease, no chronic respiratory disease, no diabetes mellitus, no immunosuppression, no recent illness and no recent travel     Past Medical History:  Diagnosis Date  . Arthritis   . Hepatitis C virus   . Hypertension   . Morbid obesity (HCC)     There are no active problems to display for this patient.   Past Surgical History:  Procedure Laterality Date  . BREAST BIOPSY Right    neg pt was 62 yrs old  . COLONOSCOPY WITH PROPOFOL N/A 10/28/2016   Procedure: COLONOSCOPY WITH PROPOFOL;  Surgeon: Scot Jun, MD;  Location: [facility] ENDOSCOPY;  Service: Endoscopy;  Laterality: N/A;  . FRACTURE SURGERY Right 1977   Right ankle surger, pins and screws    OB History    No data available       Home Medications    Prior to Admission medications   Medication Sig Start Date End Date Taking? Authorizing Provider  aspirin EC 81 MG tablet Take 81 mg by mouth daily.   Yes [provider]  hydrochlorothiazide (MICROZIDE) 12.5 MG capsule Take 12.5 mg by mouth daily.   Yes [provider]  methadone (METHADOSE) 40 MG disintegrating tablet Take 85 mg by mouth daily.   Yes [provider]  albuterol (PROVENTIL HFA;VENTOLIN HFA) 108 (90 Base) MCG/ACT inhaler Inhale 1-2 puffs into the lungs every 6 (six) hours as needed for wheezing or shortness of breath. 04/10/17    Payton Mccallum, MD  doxycycline (VIBRAMYCIN) 100 MG capsule Take 1 capsule (100 mg total) by mouth 2 (two) times daily. 02/14/17   Myrna Blazer, MD  Influenza Vac Types A & B PF (FLUVIRIN PRESERVATIVE FREE) 0.5 ML SUSY Inject into the muscle.    [provider]  meloxicam (MOBIC) 15 MG tablet Take 1 tablet (15 mg total) daily as needed by mouth. 02/06/17   Renford Dills, NP  mupirocin ointment (BACTROBAN) 2 % Apply two times a day for 7 days. 02/06/17   Renford Dills, NP  predniSONE (DELTASONE) 20 MG tablet 2 tabs po qd x 5 days 04/10/17   Payton Mccallum, MD    Family History Family History  Problem Relation Age of Onset  . Breast cancer Mother 97    Social History Social History   Tobacco Use  . Smoking status: Current Every Day Smoker    Packs/day: 1.00    Types: Cigarettes  . Smokeless tobacco: Never Used  Substance Use Topics  . Alcohol use: No  . 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  HENT: Positive for congestion.   Respiratory: Positive for cough and wheezing.   Musculoskeletal: Positive for myalgias.     Physical Exam  Triage Vital Signs ED Triage Vitals  Enc Vitals Group     BP 04/10/17 1251 139/79     Pulse Rate 04/10/17 1251 (!) 103     Resp 04/10/17 1251 15     Temp 04/10/17 1251 100.1 F (37.8 C)     Temp Source 04/10/17 1251 Oral     SpO2 04/10/17 1251 94 %     Weight 04/10/17 1252 263 lb (119.3 kg)     Height 04/10/17 1252 5\' 7"  (1.702 m)     Head Circumference --      Peak Flow --      Pain Score 04/10/17 1252 5     Pain Loc --      Pain Edu? --      Excl. in GC? --    No data found.  Updated Vital Signs BP 139/79 (BP Location: Left Arm)   Pulse (!) 103   Temp 100.1 F (37.8 C) (Oral)   Resp 15   Ht 5\' 7"  (1.702 m)   Wt 263 lb (119.3 kg)   SpO2 94%   BMI 41.19 kg/m    Visual Acuity Right Eye Distance:   Left Eye Distance:   Bilateral Distance:    Right Eye Near:   Left Eye Near:    Bilateral Near:     Physical Exam  Constitutional: She appears well-developed and well-nourished. No distress.  HENT:  Head: Normocephalic and atraumatic.  Right Ear: Tympanic membrane, external ear and ear canal normal.  Left Ear: Tympanic membrane, external ear and ear canal normal.  Nose: Rhinorrhea present. No mucosal edema, nose lacerations, sinus tenderness, nasal deformity, septal deviation or nasal septal hematoma. No epistaxis.  No foreign bodies. Right sinus exhibits no maxillary sinus tenderness and no frontal sinus tenderness. Left sinus exhibits no maxillary sinus tenderness and no frontal sinus tenderness.  Mouth/Throat: Uvula is midline, oropharynx is clear and moist and mucous membranes are normal. No oropharyngeal exudate.  Eyes: Conjunctivae and EOM are normal. Pupils are equal, round, and reactive to light. Right eye exhibits no discharge. Left eye exhibits no discharge. No scleral icterus.  Neck: Normal range of motion. Neck supple. No thyromegaly present.  Cardiovascular: Normal rate, regular rhythm and normal heart sounds.  Pulmonary/Chest: Effort normal. No stridor. No respiratory distress. She has wheezes (expiratory, diffuse and few rhonchi). She has no rales.  Lymphadenopathy:    She has no cervical adenopathy.  Skin: She is not diaphoretic.  Nursing note and vitals reviewed.    UC Treatments / Results  Labs (all labs ordered are listed, but only abnormal results are displayed) Labs Reviewed - No data to display  EKG  EKG Interpretation None       Radiology Dg Chest 2 View  Result Date: 04/10/2017 CLINICAL DATA:  Fever chest congestion and cough EXAM: CHEST  2 VIEW COMPARISON:  None. FINDINGS: Diffuse mildly reticular interstitial lung opacity. No focal consolidation or effusion. Normal heart size. No pneumothorax. Mild  degenerative changes of the spine. IMPRESSION: Diffuse mildly reticular interstitial lung opacity, could relate to viral pneumonia. There is no focal pulmonary opacity visualized. Electronically Signed   By: Jasmine Pang M.D.   On: 04/10/2017 14:07    Procedures Procedures (including critical care time)  Medications Ordered in UC Medications  ipratropium-albuterol (DUONEB) 0.5-2.5 (3) MG/3ML nebulizer solution 3 mL (3 mLs Nebulization Given 04/10/17 1342)  ibuprofen (ADVIL,MOTRIN) tablet 600 mg (600 mg Oral Given 04/10/17 1341)     Initial Impression / Assessment  and Plan / UC Course  I have reviewed the triage vital signs and the nursing notes.  Pertinent labs & imaging results that were available during my care of the patient were reviewed by me and considered in my medical decision making (see chart for details).       Final Clinical Impressions(s) / UC Diagnoses   Final diagnoses:  Viral respiratory infection  Bronchospasm  Wheezing    ED Discharge Orders        Ordered    albuterol (PROVENTIL HFA;VENTOLIN HFA) 108 (90 Base) MCG/ACT inhaler  Every 6 hours PRN     04/10/17 1418    predniSONE (DELTASONE) 20 MG tablet     04/10/17 1418     1. x-ray results and diagnosis reviewed with patient; given duoneb x 1 with improvement of symptoms; given ibuprofen as per orders 2. rx as per orders above; reviewed possible side effects, interactions, risks and benefits  3. Recommend supportive treatment with rest, fluids 4. Follow-up prn if symptoms worsen or don't improve  Controlled Substance Prescriptions [place] Controlled Substance Registry consulted? Not Applicable   Payton Mccallumonty, Zabella Wease, MD 04/10/17 1445    Payton Mccallumonty, Shneur [place], MD 04/10/17 1446

## 2017-04-13 ENCOUNTER — Telehealth: Payer: Self-pay | Admitting: Emergency Medicine

## 2017-04-13 NOTE — Telephone Encounter (Signed)
Called to follow up with patient after her recent visit. Patient states she is feeling better and does not have a fever anymore.

## 2017-05-11 ENCOUNTER — Other Ambulatory Visit: Payer: Self-pay | Admitting: Student

## 2017-05-11 DIAGNOSIS — K219 Gastro-esophageal reflux disease without esophagitis: Secondary | ICD-10-CM

## 2017-05-12 ENCOUNTER — Ambulatory Visit
Admission: RE | Admit: 2017-05-12 | Discharge: 2017-05-12 | Disposition: A | Discharge status: Home / Self Care | Payer: BLUE CROSS/BLUE SHIELD | Source: Ambulatory Visit | Attending: Student | Admitting: Student

## 2017-05-12 DIAGNOSIS — K219 Gastro-esophageal reflux disease without esophagitis: Secondary | ICD-10-CM | POA: Insufficient documentation

## 2017-05-26 ENCOUNTER — Ambulatory Visit (INDEPENDENT_AMBULATORY_CARE_PROVIDER_SITE_OTHER): Payer: BLUE CROSS/BLUE SHIELD

## 2017-05-26 ENCOUNTER — Other Ambulatory Visit: Payer: Self-pay

## 2017-05-26 ENCOUNTER — Encounter: Payer: Self-pay | Admitting: Emergency Medicine

## 2017-05-26 ENCOUNTER — Ambulatory Visit
Admission: EM | Admit: 2017-05-26 | Discharge: 2017-05-26 | Disposition: A | Discharge status: Home / Self Care | Payer: BLUE CROSS/BLUE SHIELD | Attending: Family Medicine | Admitting: Family Medicine

## 2017-05-26 DIAGNOSIS — L03115 Cellulitis of right lower limb: Secondary | ICD-10-CM | POA: Diagnosis not present

## 2017-05-26 DIAGNOSIS — S8001XA Contusion of right knee, initial encounter: Secondary | ICD-10-CM

## 2017-05-26 MED ORDER — SULFAMETHOXAZOLE-TRIMETHOPRIM 800-160 MG PO TABS: 1.0000 | ORAL_TABLET | Freq: Two times a day (BID) | ORAL | 0 refills | Status: DC

## 2017-05-26 NOTE — ED Triage Notes (Addendum)
Patient in today c/o right knee pain after falling on 05/13/17. Patient states the knee has a "knot" on her knee that is red and hot to the touch.

## 2017-05-26 NOTE — ED Provider Notes (Signed)
[facility]    CSN: [id number] Arrival date & time: 05/26/17  1205     History   Chief Complaint Chief Complaint  Patient presents with  . Knee Pain    HPI Evelyn Yates is a 62 y.o. female.   62 yo female with a c/o right knee pain after falling about 2 weeks ago. States since then skin below the knee, on the shin, was become red and tender. Denies any drainage, fevers, chills, calf pain, shortness of breath.    The history is provided by the patient.  Knee Pain    Past Medical History:  Diagnosis Date  . Arthritis   . Hepatitis C virus   . Hypertension   . Morbid obesity (HCC)     There are no active problems to display for this patient.   Past Surgical History:  Procedure Laterality Date  . BREAST BIOPSY Right    neg pt was 62 yrs old  . COLONOSCOPY WITH PROPOFOL N/A 10/28/2016   Procedure: COLONOSCOPY WITH PROPOFOL;  Surgeon: Scot JunElliott, Robert T, MD;  [facility];  Service: Endoscopy;  Laterality: N/A;  . FRACTURE SURGERY Right 1977   Right ankle surger, pins and screws    OB History    No data available       Home Medications    Prior to Admission medications   Medication Sig Start Date End Date Taking? Authorizing Provider  aspirin EC 81 MG tablet Take 81 mg by mouth daily.   Yes [provider]  hydrochlorothiazide (MICROZIDE) 12.5 MG capsule Take 12.5 mg by mouth daily.   Yes [provider]  methadone (METHADOSE) 40 MG disintegrating tablet Take 85 mg by mouth daily.   Yes [provider]  mupirocin ointment (BACTROBAN) 2 % Apply two times a day for 7 days. 02/06/17  Yes Renford DillsMiller, Lindsey, NP  albuterol (PROVENTIL HFA;VENTOLIN HFA) 108 (90 Base) MCG/ACT inhaler Inhale 1-2 puffs into the lungs every 6 (six) hours as needed for wheezing or shortness of breath. 04/10/17   Payton Mccallumonty, Karlis Cregg, MD  doxycycline (VIBRAMYCIN) 100 MG capsule Take 1 capsule (100 mg total) by mouth 2 (two) times daily. 02/14/17    Myrna BlazerSchaevitz, David Matthew, MD  Influenza Vac Types A & B PF (FLUVIRIN PRESERVATIVE FREE) 0.5 ML SUSY Inject into the muscle.    [provider]  meloxicam (MOBIC) 15 MG tablet Take 1 tablet (15 mg total) daily as needed by mouth. 02/06/17   Renford DillsMiller, Lindsey, NP  predniSONE (DELTASONE) 20 MG tablet 2 tabs po qd x 5 days 04/10/17   Payton Mccallumonty, Vikrant Pryce, MD  sulfamethoxazole-trimethoprim (BACTRIM DS,SEPTRA DS) 800-160 MG tablet Take 1 tablet by mouth 2 (two) times daily. 05/26/17   Payton Mccallumonty, Courtne Lighty, MD    Family History Family History  Problem Relation Age of Onset  . Breast cancer Mother 2067    Social History Social History   Tobacco Use  . Smoking status: Current Every Day Smoker    Packs/day: 1.00    Types: Cigarettes  . Smokeless tobacco: Never Used  Substance Use Topics  . Alcohol use: No  . 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 --      Excl. in GC? --    No data found.  Updated Vital Signs BP (!) 145/62 (BP Location: Left Arm)   Pulse 93   Temp 99 F (37.2 C) (Oral)   Resp 16   Ht 5\' 7"  (1.702 m)   Wt 263 lb (119.3 kg)   SpO2 99%   BMI 41.19 kg/m   Visual Acuity Right Eye Distance:   Left Eye Distance:   Bilateral Distance:    Right Eye Near:   Left Eye Near:    Bilateral Near:     Physical Exam  Constitutional: She appears well-developed and well-nourished. No distress.  Musculoskeletal:        Right knee: She exhibits swelling (mild) and bony tenderness. She exhibits normal range of motion, no effusion, no ecchymosis, no deformity, no laceration, no erythema, normal alignment, no LCL laxity, normal patellar mobility, normal meniscus and no MCL laxity. Tenderness found. LCL tenderness noted. No lateral joint line, no MCL and no patellar tendon tenderness noted.       Legs: Blanchable erythema, warmth and tenderness to palpation of the skin  Skin: She is not diaphoretic.  Nursing note and vitals reviewed.    UC Treatments / Results  Labs (all labs ordered are listed, but only abnormal results are displayed) Labs Reviewed - No data to display  EKG  EKG Interpretation None       Radiology Dg Knee Complete 4 Views Right  Result Date: 05/26/2017 CLINICAL DATA:  Status post fall 2 weeks ago EXAM: RIGHT KNEE - COMPLETE 4+ VIEW COMPARISON:  None. FINDINGS: Generalized osteopenia. No acute fracture or dislocation. No joint effusion. Moderate medial femorotibial compartment joint space narrowing consistent with osteoarthritis with small marginal osteophytes. No acute osseous abnormality. IMPRESSION: No acute osseous injury of the right knee. Electronically Signed   By: Elige Ko   On: 05/26/2017 13:47    Procedures Procedures (including critical care time)  Medications Ordered in UC Medications - No data to display   Initial Impression / Assessment and Plan / UC Course  I have reviewed the triage vital signs and the nursing notes.  Pertinent labs & imaging results that were available during my care of the patient were reviewed by me and considered in my medical decision making (see chart for details).       Final Clinical Impressions(s) / UC Diagnoses   Final diagnoses:  Cellulitis of leg, right  Contusion of right knee, initial encounter    ED Discharge Orders        Ordered    sulfamethoxazole-trimethoprim (BACTRIM DS,SEPTRA DS) 800-160 MG tablet  2 times daily      05/26/17 1353     1. x-ray results and diagnosis reviewed with patient 2. rx as per orders above; reviewed possible side effects, interactions, risks and benefits  3. Recommend supportive treatment with rest, elevation, warm compresses to affected area; otc analgesics prn 4. Follow-up prn  if symptoms worsen or don't improve  Controlled Substance Prescriptions [place] Controlled Substance Registry consulted? Not Applicable   Payton Mccallum, MD 05/26/17 [phone]

## 2018-01-05 ENCOUNTER — Other Ambulatory Visit: Payer: Self-pay | Admitting: Internal Medicine

## 2018-01-05 DIAGNOSIS — Z1231 Encounter for screening mammogram for malignant neoplasm of breast: Secondary | ICD-10-CM

## 2018-02-08 ENCOUNTER — Encounter (INDEPENDENT_AMBULATORY_CARE_PROVIDER_SITE_OTHER): Payer: Self-pay

## 2018-02-08 ENCOUNTER — Ambulatory Visit
Admission: RE | Admit: 2018-02-08 | Discharge: 2018-02-08 | Disposition: A | Discharge status: Home / Self Care | Payer: BLUE CROSS/BLUE SHIELD | Source: Ambulatory Visit | Attending: Internal Medicine | Admitting: Internal Medicine

## 2018-02-08 DIAGNOSIS — Z1231 Encounter for screening mammogram for malignant neoplasm of breast: Secondary | ICD-10-CM

## 2018-06-19 ENCOUNTER — Ambulatory Visit
Admission: EM | Admit: 2018-06-19 | Discharge: 2018-06-19 | Disposition: A | Discharge status: Home / Self Care | Payer: BLUE CROSS/BLUE SHIELD | Attending: Family Medicine | Admitting: Family Medicine

## 2018-06-19 ENCOUNTER — Other Ambulatory Visit: Payer: Self-pay

## 2018-06-19 DIAGNOSIS — L03116 Cellulitis of left lower limb: Secondary | ICD-10-CM | POA: Diagnosis not present

## 2018-06-19 MED ORDER — MUPIROCIN 2 % EX OINT: TOPICAL_OINTMENT | CUTANEOUS | 0 refills | Status: DC

## 2018-06-19 MED ORDER — DOXYCYCLINE HYCLATE 100 MG PO CAPS: 100.0000 mg | ORAL_CAPSULE | Freq: Two times a day (BID) | ORAL | 0 refills | Status: DC

## 2018-06-19 NOTE — ED Provider Notes (Signed)
[facility] ____________________________________________  Time seen: Approximately 1:23 PM  I have reviewed the triage vital signs and the nursing notes.   HISTORY  Chief Complaint Foot Pain   HPI Evelyn Yates is a 63 y.o. female presenting for evaluation of right medial foot wound present for approximately 1 week.  States over the last few days noticed a red color around the area.  Minimal drainage on Band-Aid.  Denies other drainage.  States area is tender.  States she has some chronic bilateral lower extremity edema, denies any change associated with this.  Previous right ankle surgery, post what sounds to have been a trimalleolar fracture by description.  States that for the last 1 month in the same area she noticed a small callused dry skin area to the same spot, and further states she has the same on her left foot as well in the same location.  Does states that she wears the same shoes and multiple colors most every day with her compression stockings.  Denies injury, trauma, pain radiation, paresthesias, decreased range of motion or other complaints.  No fevers.  Denies chest pain or shortness of breath.  Reports otherwise doing well.  Unresolved with topical antibiotic ointment and elevation.  Previously had cellulitis on right toe that failed treatment with Bactrim, but per patient resolved with doxycycline.  No PCP     Past Medical History:  Diagnosis Date  . Arthritis   . Hepatitis C virus   . Hypertension   . Morbid obesity (HCC)    Via careeverywhere last tdap 2013 given  There are no active problems to display for this patient.   Past Surgical History:  Procedure Laterality Date  . BREAST BIOPSY Right    neg pt was 63 yrs old  . COLONOSCOPY WITH PROPOFOL N/A 10/28/2016   Procedure: COLONOSCOPY WITH PROPOFOL;  Surgeon: Scot Jun, MD;  Location: [facility] ENDOSCOPY;  Service: Endoscopy;  Laterality: N/A;  . FRACTURE SURGERY Right 1977   Right ankle  surger, pins and screws     No current facility-administered medications for this encounter.   Current Outpatient Medications:  .  aspirin EC 81 MG tablet, Take 81 mg by mouth daily., Disp: , Rfl:  .  hydrochlorothiazide (MICROZIDE) 12.5 MG capsule, Take 12.5 mg by mouth daily., Disp: , Rfl:  .  methadone (METHADOSE) 40 MG disintegrating tablet, Take 85 mg by mouth daily., Disp: , Rfl:  .  metoprolol succinate (TOPROL-XL) 25 MG 24 hr tablet, TAKE 1 TABLET BY MOUTH ONCE DAILY FOLLOW WITH PCP IN YOUR NETWORK (OR HERE) SOON FOR HEART RATE FOLLOW UP., Disp: , Rfl:  .  omeprazole (PRILOSEC) 40 MG capsule, TAKE 1 CAPSULE BY MOUTH TWICE DAILY TAKE 1 CAPSULE BY MOUTH 30 MINS BEFORE BREAKFAST AND 1 CAPSULE BY MOUTH 30 MINS BEFORE DINNER, Disp: , Rfl:  .  doxycycline (VIBRAMYCIN) 100 MG capsule, Take 1 capsule (100 mg total) by mouth 2 (two) times daily., Disp: 20 capsule, Rfl: 0 .  mupirocin ointment (BACTROBAN) 2 %, Apply two times a day for 7 days., Disp: 22 g, Rfl: 0  Allergies Shellfish-derived products  Family History  Problem Relation Age of Onset  . Breast cancer Mother 64    Social History Social History   Tobacco Use  . Smoking status: Current Every Day Smoker    Packs/day: 1.00    Types: Cigarettes  . Smokeless tobacco: Never Used  Substance Use Topics  . Alcohol use: No  . Drug use: No  Types: Other-see comments    Comment: Methadone daily by prescription    Review of Systems Constitutional: No fever Cardiovascular: Denies chest pain. Respiratory: Denies shortness of breath. Gastrointestinal: No abdominal pain.   Musculoskeletal: Negative for back pain. Skin: As above.   ____________________________________________   PHYSICAL EXAM:  VITAL SIGNS: ED Triage Vitals  Enc Vitals Group     BP 06/19/18 1247 134/80     Pulse Rate 06/19/18 1247 70     Resp 06/19/18 1247 16     Temp 06/19/18 1247 97.9 F (36.6 C)     Temp Source 06/19/18 1247 Oral     SpO2  06/19/18 1247 100 %     Weight 06/19/18 1244 265 lb (120.2 kg)     Height 06/19/18 1244 5\' 7"  (1.702 m)     Head Circumference --      Peak Flow --      Pain Score 06/19/18 1243 1     Pain Loc --      Pain Edu? --      Excl. in GC? --     Constitutional: Alert and oriented. Well appearing and in no acute distress. ENT      Head: Normocephalic and atraumatic. Cardiovascular: Normal rate, regular rhythm. Grossly normal heart sounds.  Good peripheral circulation. Respiratory: Normal respiratory effort without tachypnea nor retractions. Breath sounds are clear and equal bilaterally. No wheezes, rales, rhonchi. Musculoskeletal: Bilateral pedal pulses 2+ equal and easily palpated. Except:     Right medial lower ankle with above appearance with crusted wound with surrounding erythema, no fluctuance or drainage, no induration, mild tenderness to site of erythema, no surrounding tenderness. Right distal foot with normal distal sensation and capillary refill. Steady gait. Left medial lower ankle with dry calloused area in same location without tenderness or erythema.   Neurologic:  Normal speech and language.Speech is normal. No gait instability.  Skin:  Skin is warm, dry. As above.  Psychiatric: Mood and affect are normal. Speech and behavior are normal. Patient exhibits appropriate insight and judgment   ___________________________________________   LABS (all labs ordered are listed, but only abnormal results are displayed)  Labs Reviewed - No data to display   PROCEDURES Procedures    INITIAL IMPRESSION / ASSESSMENT AND PLAN / ED COURSE  Pertinent labs & imaging results that were available during my care of the patient were reviewed by me and considered in my medical decision making (see chart for details).  Well-appearing patient.  No acute distress.  Right medial foot wound as depicted above, and after reviewing patient's Keen shoes, suspect friction wound with secondary  cellulitis.  Will treat with oral doxycycline and topical Bactroban.  Discussed elevation, monitoring and supportive care.  Recommend wound check in 3 to 4 days.Discussed indication, risks and benefits of medications with patient.   Discussed follow up and return parameters including no resolution or any worsening concerns. Patient verbalized understanding and agreed to plan.   ____________________________________________   FINAL CLINICAL IMPRESSION(S) / ED DIAGNOSES  Final diagnoses:  Cellulitis of left foot     ED Discharge Orders         Ordered    doxycycline (VIBRAMYCIN) 100 MG capsule  2 times daily     06/19/18 1318    mupirocin ointment (BACTROBAN) 2 %     06/19/18 1318           Note: This dictation was prepared with Dragon dictation along with smaller phrase technology. Any transcriptional errors that  result from this process are unintentional.         Renford Dills, NP 06/19/18 1338

## 2018-06-19 NOTE — Discharge Instructions (Addendum)
Take medication as prescribed. Rest. Drink plenty of fluids. Keep clean. Elevate. Monitor closely. Wound check in 3-4 days.   Return to Urgent care for new or worsening concerns.

## 2018-06-19 NOTE — ED Triage Notes (Signed)
Patient states that she has an area on her right foot that she noticed about 1 month ago. Area looks ulcerated and red near her heel.

## 2018-07-01 ENCOUNTER — Other Ambulatory Visit: Payer: Self-pay

## 2018-07-01 ENCOUNTER — Ambulatory Visit
Admission: EM | Admit: 2018-07-01 | Discharge: 2018-07-01 | Disposition: A | Discharge status: Home / Self Care | Payer: BLUE CROSS/BLUE SHIELD | Attending: Family Medicine | Admitting: Family Medicine

## 2018-07-01 DIAGNOSIS — F17210 Nicotine dependence, cigarettes, uncomplicated: Secondary | ICD-10-CM

## 2018-07-01 DIAGNOSIS — S91301D Unspecified open wound, right foot, subsequent encounter: Secondary | ICD-10-CM | POA: Diagnosis not present

## 2018-07-01 MED ORDER — MEDIHONEY WOUND/BURN DRESSING EX GEL: CUTANEOUS | 0 refills | Status: DC

## 2018-07-01 NOTE — ED Provider Notes (Signed)
[facility]    CSN: [id number] Arrival date & time: 07/01/18  1549  History   Chief Complaint Foot wound  HPI  63 year old female presents with a foot wound.  Patient reports that she has had an ongoing right foot wound for essentially the past month.  She was seen here on 3/24.  Was placed on doxycycline.  I have reviewed her office visit.  Patient reports that her redness got better however, it seems to be recurring.  She states that she cannot get this wound to heal.  States that it often cracks open.  She reports mild redness.  She reports warmth.  There is no purulent discharge.  No pain at this time.  She continues to use Bactroban ointment.  No other associated symptoms.  No other complaints.  PMH, Surgical Hx, Family Hx, Social History reviewed and updated as below.  Past Medical History:  Diagnosis Date  . Arthritis   . Hepatitis C virus   . Hypertension   . Morbid obesity (HCC)    Past Surgical History:  Procedure Laterality Date  . BREAST BIOPSY Right    neg pt was 63 yrs old  . COLONOSCOPY WITH PROPOFOL N/A 10/28/2016   Procedure: COLONOSCOPY WITH PROPOFOL;  Surgeon: Scot Jun, MD;  Location: [facility] ENDOSCOPY;  Service: Endoscopy;  Laterality: N/A;  . FRACTURE SURGERY Right 1977   Right ankle surger, pins and screws   OB History   No obstetric history on file.    Home Medications    Prior to Admission medications   Medication Sig Start Date End Date Taking? Authorizing Provider  aspirin EC 81 MG tablet Take 81 mg by mouth daily.   Yes [provider]  hydrochlorothiazide (MICROZIDE) 12.5 MG capsule Take 12.5 mg by mouth daily.   Yes [provider]  methadone (METHADOSE) 40 MG disintegrating tablet Take 85 mg by mouth daily.   Yes [provider]  metoprolol succinate (TOPROL-XL) 25 MG 24 hr tablet TAKE 1 TABLET BY MOUTH ONCE DAILY FOLLOW WITH PCP IN YOUR NETWORK (OR HERE) SOON FOR HEART RATE FOLLOW UP. 05/08/18  Yes  [provider]  omeprazole (PRILOSEC) 40 MG capsule TAKE 1 CAPSULE BY MOUTH TWICE DAILY TAKE 1 CAPSULE BY MOUTH 30 MINS BEFORE BREAKFAST AND 1 CAPSULE BY MOUTH 30 MINS BEFORE DINNER 03/01/18  Yes [provider]  Wound Dressings (MEDIHONEY WOUND/BURN DRESSING) GEL Apply twice daily to affected area. 07/01/18   Tommie Sams, DO   Family History Family History  Problem Relation Age of Onset  . Breast cancer Mother 63   Social History Social History   Tobacco Use  . Smoking status: Current Every Day Smoker    Packs/day: 1.00    Types: Cigarettes  . Smokeless tobacco: Never Used  Substance Use Topics  . Alcohol use: No  . Drug use: No    Types: Other-see comments    Comment: Methadone daily by prescription   Allergies   Shellfish-derived products  Review of Systems Review of Systems  Constitutional: Negative for fever.  Skin: Positive for wound.   Physical Exam Triage Vital Signs ED Triage Vitals  Enc Vitals Group     BP 07/01/18 1559 127/65     Pulse Rate 07/01/18 1557 75     Resp 07/01/18 1557 18     Temp 07/01/18 1557 98.5 F (36.9 C)     Temp Source 07/01/18 1557 Oral     SpO2 07/01/18 1557 93 %  Weight 07/01/18 1600 264 lb 15.9 oz (120.2 kg)     Height --      Head Circumference --      Peak Flow --      Pain Score 07/01/18 1600 0     Pain Loc --      Pain Edu? --      Excl. in GC? --    Updated Vital Signs BP 127/65   Pulse 75   Temp 98.5 F (36.9 C) (Oral)   Resp 18   Wt 120.2 kg   SpO2 93%   BMI 41.50 kg/m   Visual Acuity Right Eye Distance:   Left Eye Distance:   Bilateral Distance:    Right Eye Near:   Left Eye Near:    Bilateral Near:     Physical Exam Vitals signs and nursing note reviewed.  Constitutional:      General: She is not in acute distress.    Appearance: Normal appearance. She is obese.  HENT:     Head: Normocephalic and atraumatic.  Eyes:     General:        Right eye: No discharge.        Left  eye: No discharge.     Conjunctiva/sclera: Conjunctivae normal.  Cardiovascular:     Comments: Bilateral lower extremity edema with evidence of venous insufficiency. Pulmonary:     Effort: Pulmonary effort is normal. No respiratory distress.  Musculoskeletal:       Feet:  Feet:     Comments: Linear, open wound noted at the labeled location.  No purulent discharge.  Mild surrounding erythema.  2+ dorsalis pedis pulse. Neurological:     Mental Status: She is alert.  Psychiatric:        Mood and Affect: Mood normal.        Behavior: Behavior normal.    UC Treatments / Results  Labs (all labs ordered are listed, but only abnormal results are displayed) Labs Reviewed - No data to display  EKG None  Radiology No results found.  Procedures Procedures (including critical care time)  Medications Ordered in UC Medications - No data to display  Initial Impression / Assessment and Plan / UC Course  I have reviewed the triage vital signs and the nursing notes.  Pertinent labs & imaging results that were available during my care of the patient were reviewed by me and considered in my medical decision making (see chart for details).    63 year old female presents with a chronic open wound of her right foot.  I do not feel that her wound is infected.  Patient seemed adamant that it is infected.  However, clinically appears to be just a nonhealing wound.  Likely secondary to venous insufficiency and morbid obesity.  Advised that she needs to see wound care.  Trial of medihoney.  Final Clinical Impressions(s) / UC Diagnoses   Final diagnoses:  Open wound of right foot, subsequent encounter     Discharge Instructions     We will have wound care call regarding scheduling a visit.  Use the medication as prescribed.  Take care  Dr. Adriana Simas    ED Prescriptions    Medication Sig Dispense Auth. Provider   Wound Dressings (MEDIHONEY WOUND/BURN DRESSING) GEL Apply twice daily to  affected area. 44 mL Tommie Sams, DO     Controlled Substance Prescriptions [place] Controlled Substance Registry consulted? Not Applicable   Tommie Sams, DO 07/01/18 1626

## 2018-07-01 NOTE — ED Triage Notes (Signed)
Pt here for redness, pain and swelling in her right foot. Was seen here 11 days ago and was given an antibiotic for it. After she finish the antibiotic the redness came back a day later. Has been trying to stay off her foot.

## 2018-07-01 NOTE — Discharge Instructions (Signed)
We will have wound care call regarding scheduling a visit.  Use the medication as prescribed.  Take care  Dr. Adriana Simas

## 2018-07-02 ENCOUNTER — Telehealth: Payer: Self-pay

## 2018-07-02 MED ORDER — MEDIHONEY WOUND/BURN DRESSING EX GEL: CUTANEOUS | 0 refills | Status: AC

## 2018-07-02 NOTE — Telephone Encounter (Signed)
Patient came in to clinic today and states that she was seen yesterday but could not get medication because [facility] did not have it. States that she called [facility] and they have it in stock and asked if we could send it there. I have sent Rx to [facility]. Patient advised. [facility]

## 2018-11-17 IMAGING — RF DG UGI W/O KUB
14 of 21 series · 14 of 21 positions shown · non-contrast
Comparison: No prior.

CLINICAL DATA: Gastroesophageal reflux disease, esophagitis
presence not specified

EXAM:
UPPER GI SERIES WITHOUT KUB
TECHNIQUE: Routine upper GI series was performed with high density and thin
barium.
FLUOROSCOPY TIME:  Fluoroscopy Time:  2 minutes 6 seconds
Radiation Exposure Index (if provided by the fluoroscopic device):
35.5 mGy
Number of Acquired Spot Images: 21

[Series 1: fluoro_barium singleshot_bw · 0.17mm/px · 1 of 1 slices shown (1 of 14)]
[im 1/1]
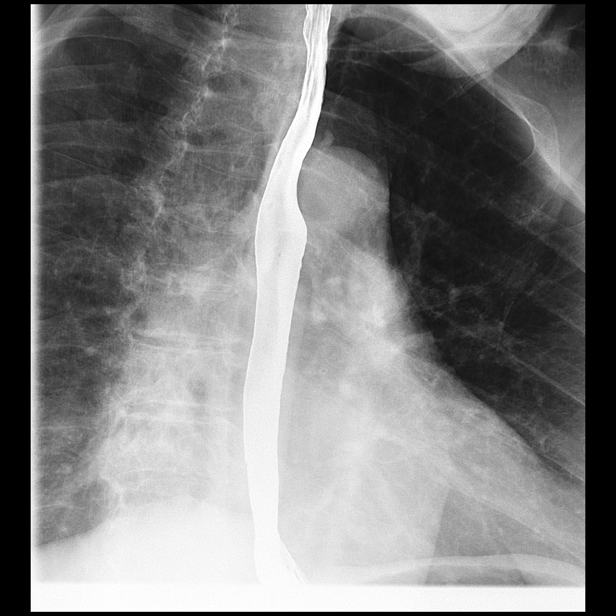

[Series 3: fluoro_barium singleshot_bw · 0.17mm/px · 1 of 1 slices shown (2 of 14)]
[im 1/1]
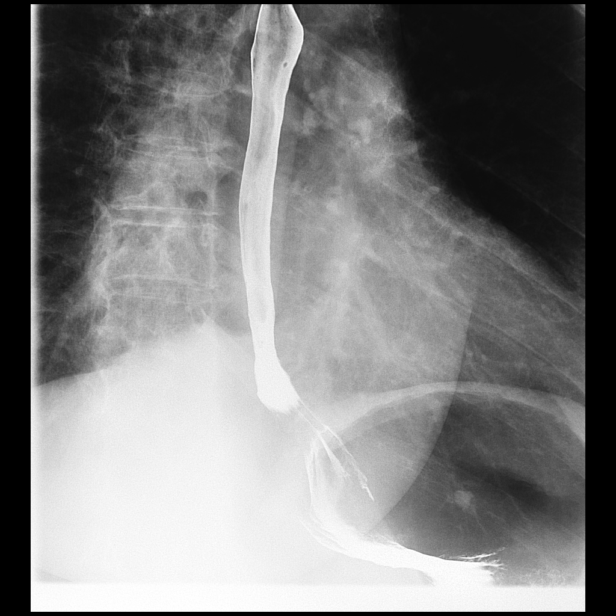

[Series 4: fluoro_barium singleshot_bw · 0.17mm/px · 1 of 1 slices shown (3 of 14)]
[im 1/1]
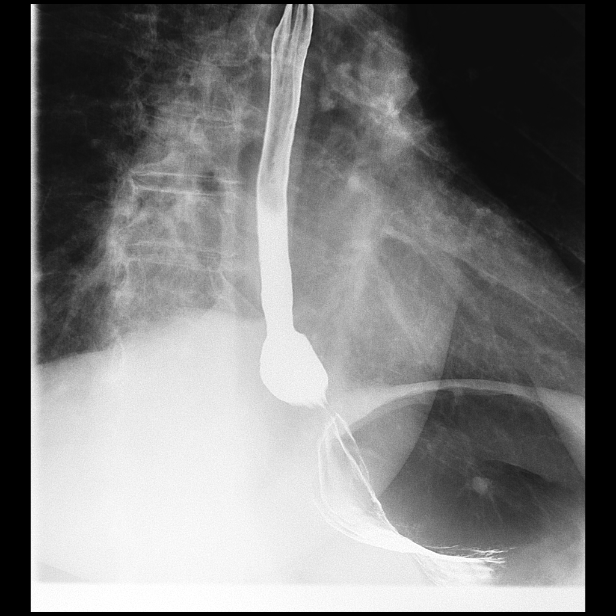

[Series 6: fluoro_barium singleshot_bw · 0.18mm/px · 1 of 1 slices shown (4 of 14)]
[im 1/1]
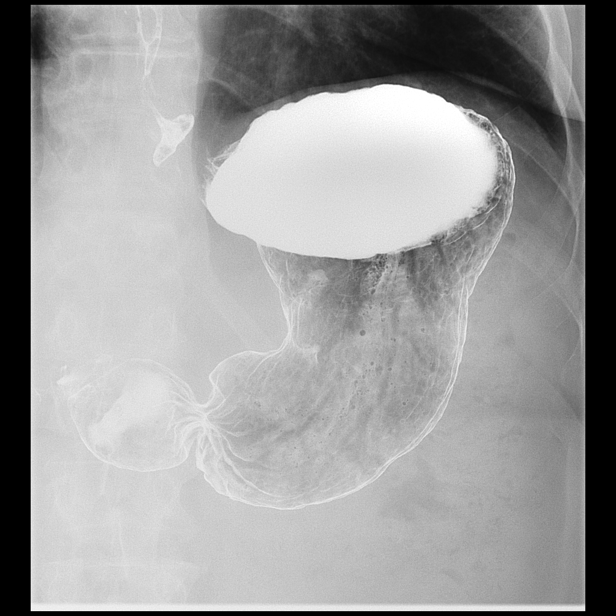

[Series 7: fluoro_barium singleshot_bw · 0.18mm/px · 1 of 1 slices shown (5 of 14)]
[im 1/1]
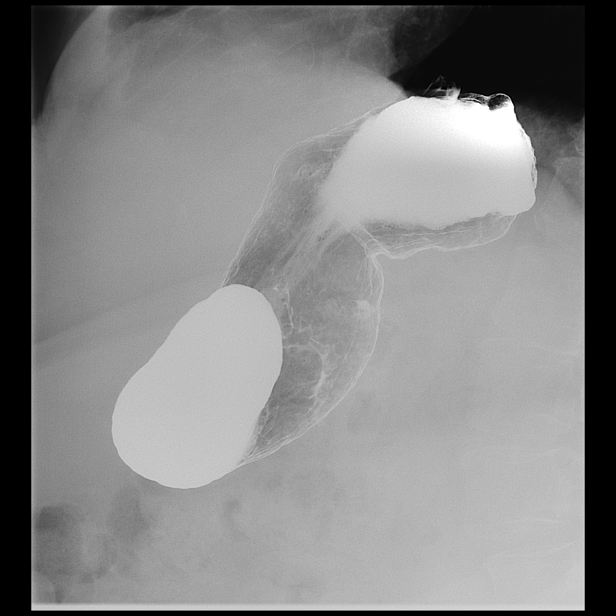

[Series 9: fluoro_barium singleshot_bw · 0.18mm/px · 1 of 1 slices shown (6 of 14)]
[im 1/1]
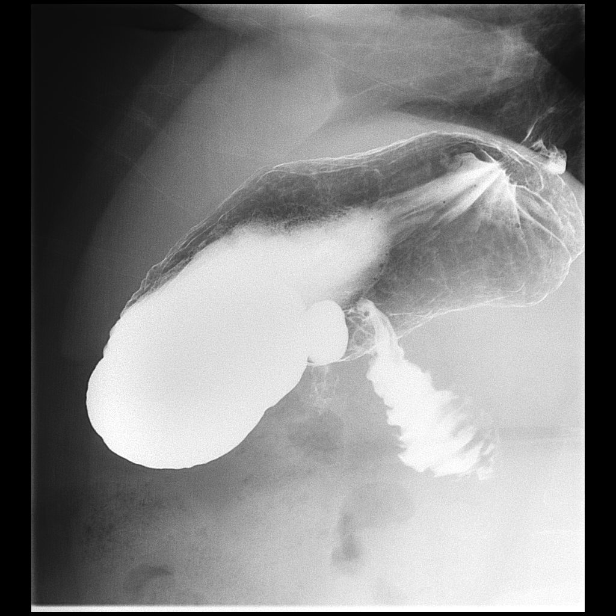

[Series 10: fluoro_barium singleshot_bw · 0.18mm/px · 1 of 1 slices shown (7 of 14)]
[im 1/1]
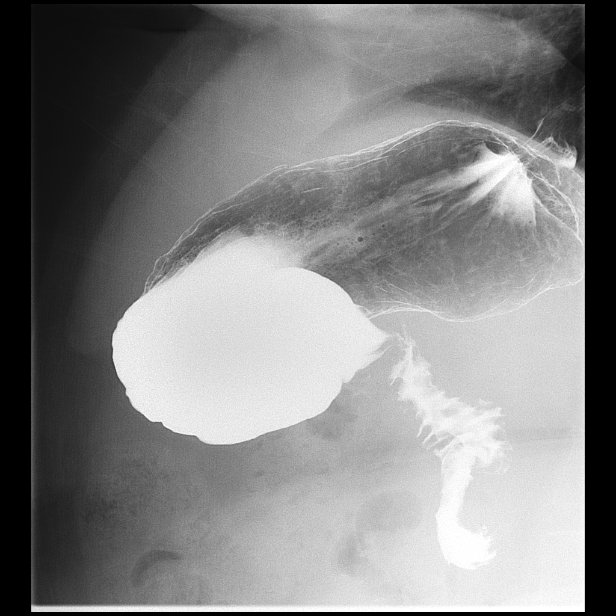

[Series 12: fluoro_barium singleshot_bw · 0.18mm/px · 1 of 1 slices shown (8 of 14)]
[im 1/1]
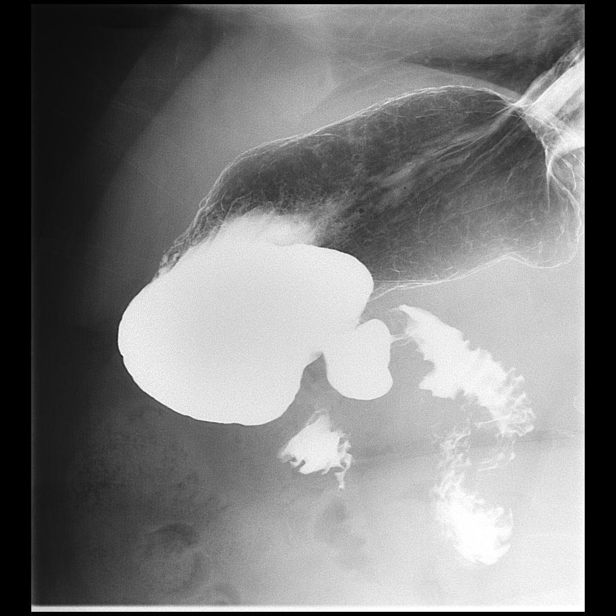

[Series 13: fluoro_barium singleshot_bw · 0.18mm/px · 1 of 1 slices shown (9 of 14)]
[im 1/1]
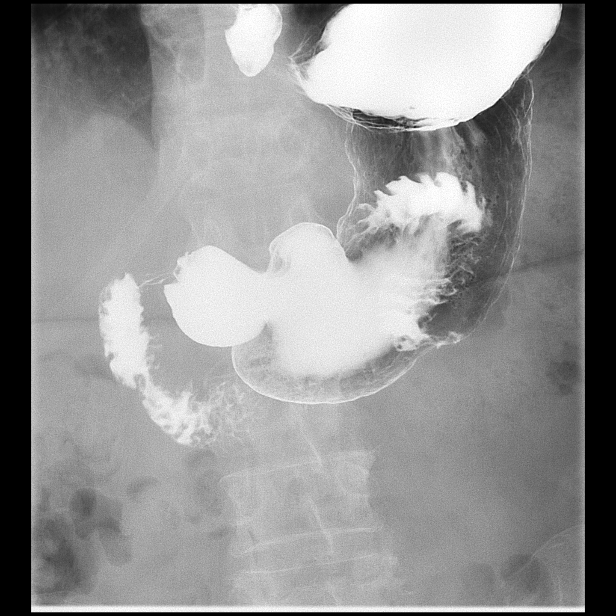

[Series 15: fluoro_barium singleshot_bw · 0.18mm/px · 1 of 1 slices shown (10 of 14)]
[im 1/1]
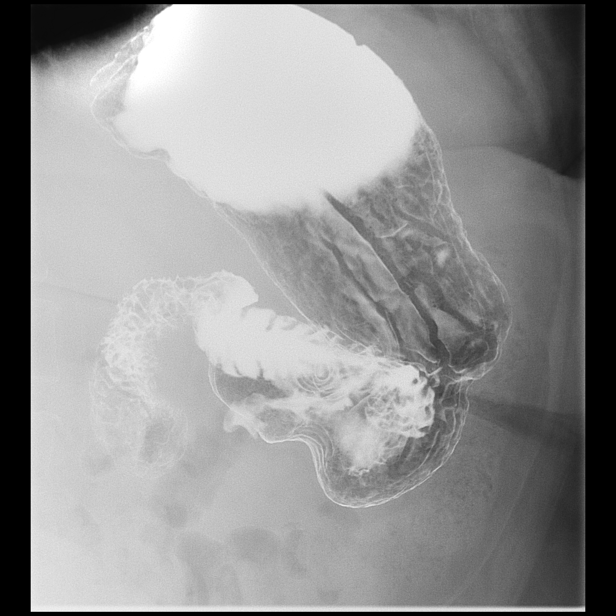

[Series 16: fluoro_barium singleshot_bw · 0.18mm/px · 1 of 1 slices shown (11 of 14)]
[im 1/1]
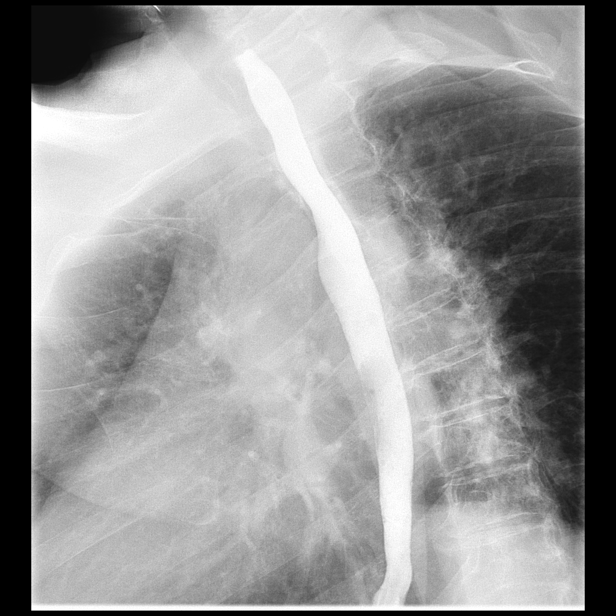

[Series 18: fluoro_barium singleshot_bw · 0.18mm/px · 1 of 1 slices shown (12 of 14)]
[im 1/1]
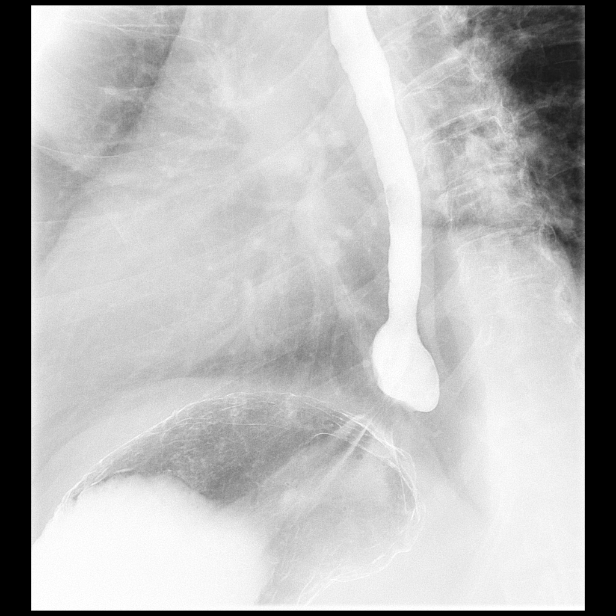

[Series 19: fluoro_barium singleshot_bw · 0.18mm/px · 1 of 1 slices shown (13 of 14)]
[im 1/1]
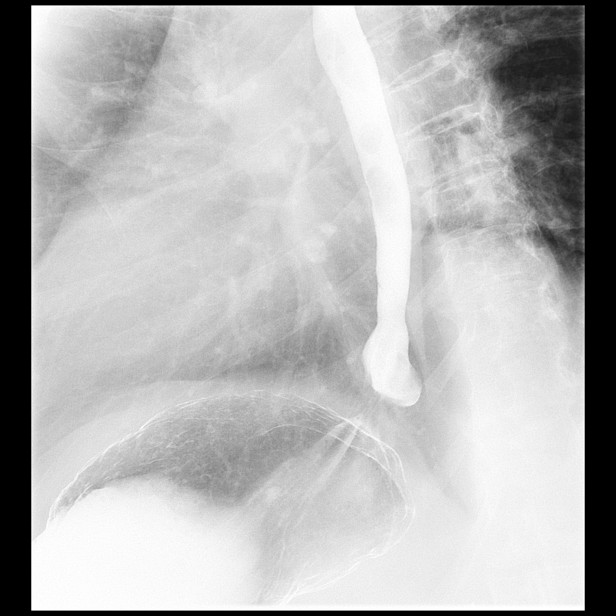

[Series 21: fluoro_barium singleshot_bw · 0.18mm/px · 1 of 1 slices shown (14 of 14)]
[im 1/1]
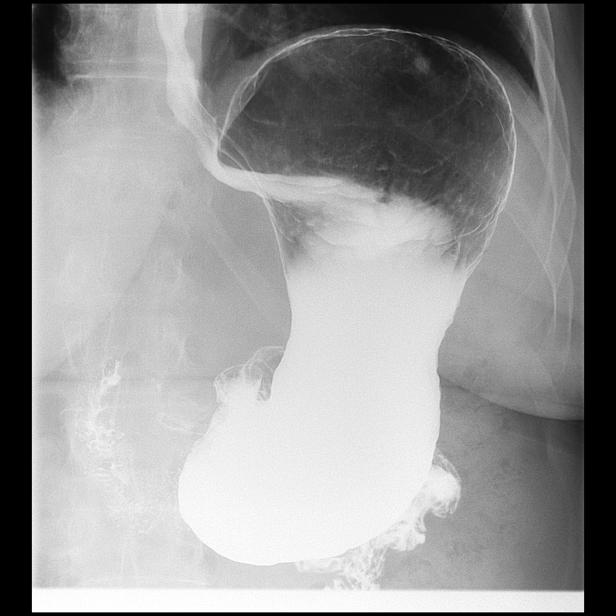

[14 of 21 positions shown; findings below may reference images not displayed]

FINDINGS: Small opacity noted over the fundus most likely represents a small
calcified granuloma in the left lung base. This was present before
barium entered this portion of the fundus of the stomach. Mild
persistent narrowing over the distal esophagus is noted. This is
just above the gastroesophageal junction and what may represent a
small hiatal hernia. Endoscopic evaluation is suggested to evaluate
for fixed distal esophageal lesion such as a scar or malignancy. No
esophageal ulcerations are noted. No gastroesophageal reflux noted.
Stomach is unremarkable. No gastric ulcerations noted. Duodenum is
unremarkable. No evidence of peptic ulcer disease.
IMPRESSION: Small focal segment of mild narrowing noted over the distal
esophagus just above what most likely represents a small hiatal
hernia. Endoscopic evaluation is suggested to evaluate for fixed
distal esophageal lesion such as scar or malignancy. No evidence of
esophageal ulceration. No gastroesophageal reflux noted. Stomach and
duodenum are normal.

## 2022-02-08 ENCOUNTER — Other Ambulatory Visit: Payer: Self-pay

## 2022-02-08 DIAGNOSIS — Z1231 Encounter for screening mammogram for malignant neoplasm of breast: Secondary | ICD-10-CM

## 2022-02-14 ENCOUNTER — Ambulatory Visit
Admission: RE | Admit: 2022-02-14 | Discharge: 2022-02-14 | Disposition: A | Discharge status: Home / Self Care | Payer: Medicare HMO | Source: Ambulatory Visit | Attending: Nurse Practitioner | Admitting: Nurse Practitioner

## 2022-02-14 ENCOUNTER — Other Ambulatory Visit: Payer: Self-pay | Admitting: Nurse Practitioner

## 2022-02-14 DIAGNOSIS — Z1231 Encounter for screening mammogram for malignant neoplasm of breast: Secondary | ICD-10-CM

## 2022-02-21 ENCOUNTER — Inpatient Hospital Stay: Admission: RE | Admit: 2022-02-21 | Payer: BLUE CROSS/BLUE SHIELD | Source: Ambulatory Visit

## 2022-02-28 ENCOUNTER — Ambulatory Visit
Admission: RE | Admit: 2022-02-28 | Discharge: 2022-02-28 | Disposition: A | Discharge status: Home / Self Care | Payer: Medicare HMO | Source: Ambulatory Visit | Attending: Nurse Practitioner | Admitting: Nurse Practitioner

## 2022-02-28 DIAGNOSIS — Z1231 Encounter for screening mammogram for malignant neoplasm of breast: Secondary | ICD-10-CM | POA: Insufficient documentation

## 2022-03-04 ENCOUNTER — Other Ambulatory Visit: Payer: Self-pay | Admitting: *Deleted

## 2022-03-04 ENCOUNTER — Inpatient Hospital Stay
Admission: RE | Admit: 2022-03-04 | Discharge: 2022-03-04 | Disposition: A | Discharge status: Home / Self Care | Payer: Self-pay | Source: Ambulatory Visit | Attending: *Deleted | Admitting: *Deleted

## 2022-03-04 DIAGNOSIS — Z1231 Encounter for screening mammogram for malignant neoplasm of breast: Secondary | ICD-10-CM

## 2023-01-06 ENCOUNTER — Other Ambulatory Visit: Payer: Self-pay | Admitting: Nurse Practitioner

## 2023-01-06 DIAGNOSIS — Z1231 Encounter for screening mammogram for malignant neoplasm of breast: Secondary | ICD-10-CM

## 2023-03-02 ENCOUNTER — Ambulatory Visit
Admission: RE | Admit: 2023-03-02 | Discharge: 2023-03-02 | Disposition: A | Discharge status: Home / Self Care | Payer: Medicare HMO | Source: Ambulatory Visit | Attending: Nurse Practitioner | Admitting: Nurse Practitioner

## 2023-03-02 DIAGNOSIS — Z1231 Encounter for screening mammogram for malignant neoplasm of breast: Secondary | ICD-10-CM | POA: Diagnosis present

## 2023-06-02 ENCOUNTER — Ambulatory Visit

## 2023-06-02 ENCOUNTER — Ambulatory Visit
Admission: EM | Admit: 2023-06-02 | Discharge: 2023-06-02 | Disposition: A | Discharge status: Home / Self Care | Attending: Emergency Medicine | Admitting: Emergency Medicine

## 2023-06-02 ENCOUNTER — Ambulatory Visit (INDEPENDENT_AMBULATORY_CARE_PROVIDER_SITE_OTHER)

## 2023-06-02 DIAGNOSIS — M79675 Pain in left toe(s): Secondary | ICD-10-CM

## 2023-06-02 DIAGNOSIS — S92412A Displaced fracture of proximal phalanx of left great toe, initial encounter for closed fracture: Secondary | ICD-10-CM

## 2023-06-02 NOTE — ED Provider Notes (Addendum)
 [facility]    CSN: [id number] Arrival date & time: 06/02/23  1512      History   Chief Complaint Chief Complaint  Patient presents with   Fall         HPI Evelyn Yates is a 68 y.o. female.   HPI  68 year old female with past medical history significant for hypertension, hepatitis C, and arthritis presents for evaluation of bruising and swelling to her left great toe.  She reports that she was walking and there was a separation on the concrete.  Her toe got stuck in the separation which caused her to stumble and then fall.  This happened just prior to arrival.  The toe is swollen and bruised.  Past Medical History:  Diagnosis Date   Arthritis    Hepatitis C virus    Hypertension    Morbid obesity (HCC)     There are no active problems to display for this patient.   Past Surgical History:  Procedure Laterality Date   BREAST BIOPSY Right    neg pt was 68 yrs old   COLONOSCOPY WITH PROPOFOL N/A 10/28/2016   Procedure: COLONOSCOPY WITH PROPOFOL;  Surgeon: Scot Jun, MD;  Location: [facility] ENDOSCOPY;  Service: Endoscopy;  Laterality: N/A;   FRACTURE SURGERY Right 1977   Right ankle surger, pins and screws    OB History   No obstetric history on file.      Home Medications    Prior to Admission medications   Medication Sig Start Date End Date Taking? Authorizing Provider  aspirin EC 81 MG tablet Take 81 mg by mouth daily.   Yes [provider]  hydrochlorothiazide (MICROZIDE) 12.5 MG capsule Take 12.5 mg by mouth daily.   Yes [provider]  methadone (METHADOSE) 40 MG disintegrating tablet Take 85 mg by mouth daily.   Yes [provider]  metoprolol succinate (TOPROL-XL) 25 MG 24 hr tablet TAKE 1 TABLET BY MOUTH ONCE DAILY FOLLOW WITH PCP IN YOUR NETWORK (OR HERE) SOON FOR HEART RATE FOLLOW UP. 05/08/18  Yes [provider]  omeprazole (PRILOSEC) 40 MG capsule TAKE 1 CAPSULE BY MOUTH TWICE DAILY TAKE 1 CAPSULE  BY MOUTH 30 MINS BEFORE BREAKFAST AND 1 CAPSULE BY MOUTH 30 MINS BEFORE DINNER 03/01/18  Yes [provider]  Wound Dressings (MEDIHONEY WOUND/BURN DRESSING) GEL Apply twice daily to affected area. 07/02/18  Yes Tommie Sams, DO    Family History Family History  Problem Relation Age of Onset   Breast cancer Mother 26    Social History Social History   Tobacco Use   Smoking status: Former    Current packs/day: 1.00    Types: Cigarettes   Smokeless tobacco: Never  Vaping Use   Vaping status: Every Day  Substance Use Topics   Alcohol use: No   Drug use: No    Types: Other-see comments    Comment: Methadone daily by prescription     Allergies   Shellfish-derived products   Review of Systems Review of Systems  Musculoskeletal:  Positive for arthralgias and joint swelling.  Skin:  Positive for color change.  Neurological:  Positive for numbness. Negative for weakness.     Physical Exam Triage Vital Signs ED Triage Vitals  Encounter Vitals Group     BP      Systolic BP Percentile      Diastolic BP Percentile      Pulse      Resp  Temp      Temp src      SpO2      Weight      Height      Head Circumference      Peak Flow      Pain Score      Pain Loc      Pain Education      Exclude from Growth Chart    No data found.  Updated Vital Signs BP (!) 143/65 (BP Location: Left Arm)   Pulse 78   Temp 98.1 F (36.7 C) (Oral)   Ht 5\' 7"  (1.702 m)   Wt 276 lb (125.2 kg)   SpO2 100%   BMI 43.23 kg/m   Visual Acuity Right Eye Distance:   Left Eye Distance:   Bilateral Distance:    Right Eye Near:   Left Eye Near:    Bilateral Near:     Physical Exam Vitals and nursing note reviewed.  Constitutional:      Appearance: Normal appearance. She is not ill-appearing.  Musculoskeletal:        General: Swelling, tenderness and signs of injury present. No deformity.  Skin:    General: Skin is warm and dry.     Capillary Refill: Capillary refill  takes less than 2 seconds.     Findings: Bruising present.  Neurological:     General: No focal deficit present.     Mental Status: She is alert and oriented to person, place, and time.      UC Treatments / Results  Labs (all labs ordered are listed, but only abnormal results are displayed) Labs Reviewed - No data to display  EKG   Radiology DG Foot Complete Left Result Date: 06/02/2023 CLINICAL DATA:  Pain in first 3 toes. Fall. Great toe bruising. Greatest pain proximal phalanx area and between first and second metatarsophalangeal joints. EXAM: LEFT FOOT - COMPLETE 3+ VIEW COMPARISON:  None Available. FINDINGS: There is diffuse decreased bone mineralization. Moderate likely posttraumatic and degenerative spurring at the medial base of the proximal phalanx of the second toe. There is mild 2 mm cortical step-off at the medial aspect of the junction of the second metatarsal head and neck. While this may represent a degenerative hook osteophytethe patient reports pain to this region. There is also mild longitudinal linear lucency within the lateral proximal aspect of the proximal phalanx of the great toe extending to the proximal articular surface, suspicious for an acute nondisplaced fracture. Severe talonavicular joint space narrowing, subchondral sclerosis, and dorsal osteophytosis. Moderate navicular-cuneiform joint space narrowing, subchondral sclerosis, and peripheral osteophytosis. Moderate calcaneocuboid osteoarthritis. Moderate pes planus. Normal Lisfranc interval. IMPRESSION: 1. Mild longitudinal linear lucency within the lateral proximal aspect of the proximal phalanx of the great toe extending to the proximal articular surface, suspicious for an acute nondisplaced fracture. 2. Mild 2 mm cortical step-off at the medial aspect of the junction of the second metatarsal head and neck. While this may represent a degenerative hook osteophyte, the patient reports pain to this region. This may  represent an acute nondisplaced fracture. Recommend clinical correlation. 3. 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 4. Moderate pes planus. Electronically Signed   By: Neita Garnet M.D.   On: 06/02/2023 16:20    Procedures Procedures (including critical care time)  Medications Ordered in UC Medications - No data to display  Initial Impression / Assessment and Plan / UC Course  I have reviewed the triage vital signs and the nursing notes.  Pertinent  labs & imaging results that were available during my care of the patient were reviewed by me and considered in my medical decision making (see chart for details).   Patient is a pleasant, nontoxic-appearing 68 year old female presenting for evaluation of pain, swelling, and bruising to her left great toe after suffering a ground-level fall just prior to arrival.  As you can see in the note above, the toe is ecchymotic and edematous from the IP joint back through the MTP joint.  Patient has no tenderness with palpation of the distal phalanx though she has tenderness at the IP joint, proximal phalanx, and MTP joint.  No appreciable crepitus.  She is able to flex and extend her great toe minimally secondary to pain and swelling.  I will obtain a radiograph to rule out any bony abnormality.  While in radiology the patient reports that she was also experiencing pain in her second and third toe.  Radiographs changed to left foot rather than just left great toe.  Left foot x-rays independent reviewed and evaluated by me.  Impression: There is a questionable, nondisplaced fracture of the proximal aspect of the proximal phalange E of the great toe.  Second and third toe appear unremarkable.  Radiology overread is pending. Radiology impression states there is a mild longitudinal linear lucency within the lateral proximal aspect of the proximal phalanx of the great toe extending into the proximal articular surface, suspicious for  an acute nondisplaced fracture..  Mild 2 mm cortical step-off of the medial aspect of the junction of the second metatarsal head and neck which may represent a degenerative hook osteophyte.  I will discharge patient with a diagnosis of nondisplaced fracture of the proximal phalanx of her left great toe and have staff outfit her with a hard sole shoe.  Ice, elevation, and over-the-counter Tylenol or NSAIDs as needed for pain.  I will give her the contact information for podiatry to follow-up with should her symptoms not improved.  Final Clinical Impressions(s) / UC Diagnoses   Final diagnoses:  Great toe pain, left  Closed displaced fracture of proximal phalanx of left great toe, initial encounter     Discharge Instructions      Your x-rays show a nondisplaced fracture of your left big toe where your toe meets your foot.  This will heal on its own but will take approximately 4 to 6 weeks.  Wear the postoperative shoe to help stabilize your toe and prevent flexion of the toe which should aid in healing and also aid in pain relief.  Keep your left foot elevated is much as possible to help decrease pain and swelling.  You may apply ice to your toe for 20 mitts at a time, 2-3 times a day, as needed for pain and swelling.  You may use over-the-counter Tylenol and/or ibuprofen according the pack instructions as needed for pain and inflammation.  If your symptoms not improving, or you feel as though they are worsening, I recommend that you follow-up with podiatry.  [facility] in [place] is associated with [place] and you can call them to make an appointment.  Their phone number is [phone].  Their address is [address] [place], [place].  [address].     ED Prescriptions   None    PDMP not reviewed this encounter.   Becky Augusta, NP 06/02/23 1623    Becky Augusta, NP 06/02/23 [phone]

## 2023-06-02 NOTE — ED Triage Notes (Signed)
 Pt c/o left toe injury after a fall  Pt states that she fell on [place] after stepping on a crack  Pt states that her toe got caught in the crack and she fell off balance  Pt states that her big toe is bruised and red.

## 2023-06-02 NOTE — Discharge Instructions (Addendum)
 Your x-rays show a nondisplaced fracture of your left big toe where your toe meets your foot.  This will heal on its own but will take approximately 4 to 6 weeks.  Wear the postoperative shoe to help stabilize your toe and prevent flexion of the toe which should aid in healing and also aid in pain relief.  Keep your left foot elevated is much as possible to help decrease pain and swelling.  You may apply ice to your toe for 20 mitts at a time, 2-3 times a day, as needed for pain and swelling.  You may use over-the-counter Tylenol and/or ibuprofen according the pack instructions as needed for pain and inflammation.  If your symptoms not improving, or you feel as though they are worsening, I recommend that you follow-up with podiatry.  [facility] in [place] is associated with [place] and you can call them to make an appointment.  Their phone number is [phone].  Their address is [address] [place], [place].  [address].

## 2023-06-02 NOTE — ED Triage Notes (Signed)
 Pt has left foot swelling, and bruising along the top of the big toe and bruising along the medial side of the foot.   Pt states that her foot "feels funny"

## 2024-01-25 ENCOUNTER — Other Ambulatory Visit: Payer: Self-pay | Admitting: Family

## 2024-01-25 DIAGNOSIS — Z1231 Encounter for screening mammogram for malignant neoplasm of breast: Secondary | ICD-10-CM

## 2024-03-05 ENCOUNTER — Ambulatory Visit
Admission: RE | Admit: 2024-03-05 | Discharge: 2024-03-05 | Disposition: A | Source: Ambulatory Visit | Attending: Family

## 2024-03-05 DIAGNOSIS — Z1231 Encounter for screening mammogram for malignant neoplasm of breast: Secondary | ICD-10-CM
